# Patient Record
Sex: Female | Born: 1945 | Race: Asian | Hispanic: No | Smoking: Never smoker
Health system: Southern US, Community
[De-identification: ages and names within clinical notes are randomized; demographics above are authoritative.]

## PROBLEM LIST (undated history)

## (undated) DIAGNOSIS — M1711 Unilateral primary osteoarthritis, right knee: Secondary | ICD-10-CM

## (undated) DIAGNOSIS — R159 Full incontinence of feces: Secondary | ICD-10-CM

## (undated) DIAGNOSIS — Z86711 Personal history of pulmonary embolism: Secondary | ICD-10-CM

## (undated) DIAGNOSIS — R32 Unspecified urinary incontinence: Secondary | ICD-10-CM

## (undated) DIAGNOSIS — M1990 Unspecified osteoarthritis, unspecified site: Secondary | ICD-10-CM

## (undated) DIAGNOSIS — S42201A Unspecified fracture of upper end of right humerus, initial encounter for closed fracture: Secondary | ICD-10-CM

## (undated) DIAGNOSIS — I10 Essential (primary) hypertension: Secondary | ICD-10-CM

## (undated) HISTORY — DX: Unilateral primary osteoarthritis, right knee: M17.11

## (undated) HISTORY — DX: Unspecified osteoarthritis, unspecified site: M19.90

---

## 2002-04-27 ENCOUNTER — Encounter: Admission: RE | Admit: 2002-04-27 | Discharge: 2002-04-27 | Payer: Self-pay | Admitting: Gastroenterology

## 2002-04-27 ENCOUNTER — Encounter: Payer: Self-pay | Admitting: Gastroenterology

## 2006-05-28 ENCOUNTER — Encounter: Admission: RE | Admit: 2006-05-28 | Discharge: 2006-05-28 | Payer: Self-pay | Admitting: Orthopedic Surgery

## 2008-01-21 HISTORY — PX: JOINT REPLACEMENT: SHX530

## 2008-08-14 ENCOUNTER — Inpatient Hospital Stay (HOSPITAL_COMMUNITY): Admission: RE | Admit: 2008-08-14 | Discharge: 2008-08-21 | Payer: Self-pay | Admitting: Orthopedic Surgery

## 2008-08-14 ENCOUNTER — Ambulatory Visit: Payer: Self-pay | Admitting: Pulmonary Disease

## 2008-08-17 ENCOUNTER — Ambulatory Visit: Payer: Self-pay | Admitting: Physical Medicine & Rehabilitation

## 2008-08-29 ENCOUNTER — Telehealth: Payer: Self-pay | Admitting: Internal Medicine

## 2008-08-29 ENCOUNTER — Encounter: Payer: Self-pay | Admitting: Internal Medicine

## 2008-08-31 ENCOUNTER — Encounter: Payer: Self-pay | Admitting: Cardiology

## 2008-08-31 DIAGNOSIS — I2699 Other pulmonary embolism without acute cor pulmonale: Secondary | ICD-10-CM

## 2008-08-31 DIAGNOSIS — M1990 Unspecified osteoarthritis, unspecified site: Secondary | ICD-10-CM | POA: Insufficient documentation

## 2008-09-01 ENCOUNTER — Ambulatory Visit: Payer: Self-pay | Admitting: Internal Medicine

## 2008-09-01 DIAGNOSIS — I2699 Other pulmonary embolism without acute cor pulmonale: Secondary | ICD-10-CM | POA: Insufficient documentation

## 2008-09-08 ENCOUNTER — Ambulatory Visit: Payer: Self-pay | Admitting: Cardiology

## 2008-09-15 ENCOUNTER — Ambulatory Visit: Payer: Self-pay | Admitting: Cardiovascular Disease

## 2008-09-22 ENCOUNTER — Ambulatory Visit: Payer: Self-pay | Admitting: Internal Medicine

## 2008-09-29 ENCOUNTER — Ambulatory Visit: Payer: Self-pay | Admitting: Internal Medicine

## 2008-09-29 LAB — CONVERTED CEMR LAB: POC INR: 2.2

## 2008-10-13 ENCOUNTER — Ambulatory Visit: Payer: Self-pay | Admitting: Internal Medicine

## 2008-10-13 LAB — CONVERTED CEMR LAB: POC INR: 3.2

## 2008-10-27 ENCOUNTER — Ambulatory Visit: Payer: Self-pay | Admitting: Internal Medicine

## 2008-10-27 LAB — CONVERTED CEMR LAB: POC INR: 2.3

## 2008-11-14 ENCOUNTER — Ambulatory Visit: Payer: Self-pay | Admitting: Internal Medicine

## 2008-11-17 ENCOUNTER — Ambulatory Visit: Payer: Self-pay | Admitting: Internal Medicine

## 2010-04-27 LAB — CBC
HCT: 26.3 % — ABNORMAL LOW (ref 36.0–46.0)
HCT: 26.6 % — ABNORMAL LOW (ref 36.0–46.0)
Hemoglobin: 9.1 g/dL — ABNORMAL LOW (ref 12.0–15.0)
Hemoglobin: 9.1 g/dL — ABNORMAL LOW (ref 12.0–15.0)
MCHC: 34.6 g/dL (ref 30.0–36.0)
MCV: 92.7 fL (ref 78.0–100.0)
MCV: 93.2 fL (ref 78.0–100.0)
Platelets: 219 10*3/uL (ref 150–400)
RBC: 2.82 MIL/uL — ABNORMAL LOW (ref 3.87–5.11)
RDW: 12.6 % (ref 11.5–15.5)
RDW: 12.7 % (ref 11.5–15.5)
WBC: 5.1 10*3/uL (ref 4.0–10.5)

## 2010-04-27 LAB — PROTIME-INR
INR: 2.6 — ABNORMAL HIGH (ref 0.00–1.49)
Prothrombin Time: 29.5 seconds — ABNORMAL HIGH (ref 11.6–15.2)

## 2010-04-27 LAB — HEPARIN LEVEL (UNFRACTIONATED)

## 2010-04-28 LAB — CBC
HCT: 27.9 % — ABNORMAL LOW (ref 36.0–46.0)
HCT: 39.7 % (ref 36.0–46.0)
Hemoglobin: 10.9 g/dL — ABNORMAL LOW (ref 12.0–15.0)
MCHC: 34.1 g/dL (ref 30.0–36.0)
MCHC: 34.3 g/dL (ref 30.0–36.0)
MCHC: 34.4 g/dL (ref 30.0–36.0)
MCHC: 34.7 g/dL (ref 30.0–36.0)
MCV: 92.4 fL (ref 78.0–100.0)
MCV: 93 fL (ref 78.0–100.0)
MCV: 93.4 fL (ref 78.0–100.0)
MCV: 93.4 fL (ref 78.0–100.0)
Platelets: 172 10*3/uL (ref 150–400)
Platelets: 188 10*3/uL (ref 150–400)
Platelets: 215 10*3/uL (ref 150–400)
Platelets: 259 10*3/uL (ref 150–400)
RBC: 2.83 MIL/uL — ABNORMAL LOW (ref 3.87–5.11)
RBC: 2.99 MIL/uL — ABNORMAL LOW (ref 3.87–5.11)
RBC: 3.19 MIL/uL — ABNORMAL LOW (ref 3.87–5.11)
RDW: 12.4 % (ref 11.5–15.5)
RDW: 12.6 % (ref 11.5–15.5)
RDW: 12.7 % (ref 11.5–15.5)
WBC: 5.9 10*3/uL (ref 4.0–10.5)
WBC: 6.8 10*3/uL (ref 4.0–10.5)

## 2010-04-28 LAB — BASIC METABOLIC PANEL
BUN: 6 mg/dL (ref 6–23)
CO2: 28 mEq/L (ref 19–32)
Calcium: 8.4 mg/dL (ref 8.4–10.5)
Calcium: 8.6 mg/dL (ref 8.4–10.5)
Chloride: 105 mEq/L (ref 96–112)
Chloride: 105 mEq/L (ref 96–112)
Creatinine, Ser: 0.57 mg/dL (ref 0.4–1.2)
Creatinine, Ser: 0.6 mg/dL (ref 0.4–1.2)
GFR calc Af Amer: >60 mL/min (ref >60)
GFR calc Af Amer: >60 mL/min (ref >60)
GFR calc non Af Amer: >60 mL/min (ref >60)
Glucose, Bld: 127 mg/dL — ABNORMAL HIGH (ref 70–99)
Sodium: 136 mEq/L (ref 135–145)

## 2010-04-28 LAB — APTT: aPTT: 29 seconds (ref 24–37)

## 2010-04-28 LAB — URINALYSIS, ROUTINE W REFLEX MICROSCOPIC
Ketones, ur: NEGATIVE mg/dL
Nitrite: NEGATIVE
Protein, ur: NEGATIVE mg/dL
Urobilinogen, UA: 0.2 mg/dL (ref 0.0–1.0)

## 2010-04-28 LAB — HEPARIN LEVEL (UNFRACTIONATED)
Heparin Unfractionated: 0.25 IU/mL — ABNORMAL LOW (ref 0.30–0.70)
Heparin Unfractionated: 0.32 IU/mL (ref 0.30–0.70)
Heparin Unfractionated: 0.33 IU/mL (ref 0.30–0.70)
Heparin Unfractionated: 0.36 IU/mL (ref 0.30–0.70)
Heparin Unfractionated: 0.48 IU/mL (ref 0.30–0.70)

## 2010-04-28 LAB — COMPREHENSIVE METABOLIC PANEL
BUN: 13 mg/dL (ref 6–23)
CO2: 27 mEq/L (ref 19–32)
Calcium: 10.2 mg/dL (ref 8.4–10.5)
Chloride: 102 mEq/L (ref 96–112)
Creatinine, Ser: 0.68 mg/dL (ref 0.4–1.2)
GFR calc non Af Amer: >60 mL/min (ref >60)
Total Bilirubin: 0.9 mg/dL (ref 0.3–1.2)

## 2010-04-28 LAB — URINALYSIS, MICROSCOPIC ONLY
Nitrite: NEGATIVE
Specific Gravity, Urine: 1.03 (ref 1.005–1.030)
Urobilinogen, UA: 0.2 mg/dL (ref 0.0–1.0)

## 2010-04-28 LAB — PROTIME-INR
INR: 1 (ref 0.00–1.49)
INR: 1.2 (ref 0.00–1.49)
INR: 1.9 — ABNORMAL HIGH (ref 0.00–1.49)
INR: 2 — ABNORMAL HIGH (ref 0.00–1.49)
Prothrombin Time: 13.1 seconds (ref 11.6–15.2)
Prothrombin Time: 21.5 seconds — ABNORMAL HIGH (ref 11.6–15.2)
Prothrombin Time: 22.7 seconds — ABNORMAL HIGH (ref 11.6–15.2)
Prothrombin Time: 23.9 seconds — ABNORMAL HIGH (ref 11.6–15.2)
Prothrombin Time: 26.6 seconds — ABNORMAL HIGH (ref 11.6–15.2)

## 2010-04-28 LAB — DIFFERENTIAL
Basophils Absolute: 0 10*3/uL (ref 0.0–0.1)
Lymphocytes Relative: 28 % (ref 12–46)
Lymphs Abs: 1.7 10*3/uL (ref 0.7–4.0)
Neutro Abs: 3.8 10*3/uL (ref 1.7–7.7)

## 2010-04-28 LAB — TYPE AND SCREEN: Antibody Screen: NEGATIVE

## 2010-04-28 LAB — ABO/RH: ABO/RH(D): A POS

## 2010-04-28 LAB — URINE CULTURE: Culture: NO GROWTH

## 2010-06-04 NOTE — Discharge Summary (Signed)
Stacy Wilkinson, Stacy Wilkinson                   ACCOUNT NO.:  [id number]   MEDICAL RECORD NO.:  [id number]          PATIENT TYPE:  INP   LOCATION:  5034                         FACILITY:  [facility]   PHYSICIAN:  Robert A. Thurston Hole, M.D. DATE OF BIRTH:  1945/01/29   DATE OF ADMISSION:  08/14/2008  DATE OF DISCHARGE:  08/19/2008                               DISCHARGE SUMMARY   ADMISSION DIAGNOSIS:  Left knee end-stage osteoarthritis.   SECONDARY DIAGNOSIS:  Pulmonary embolus.   PROCEDURE:  While in hospital, left total knee arthroplasty.   DISCHARGE SUMMARY:  The patient is a 65 year old female with a history  of end-stage DJD of the left knee and has failed conservative care.   ALLERGIES:  No known drug allergies.   MEDICATIONS AT TIME OF ADMISSION:  No prescription medications, multiple  over-the-counter supplements.   PAST MEDICAL HISTORY:  Significant for DJD.   FAMILY HISTORY:  Significant for mother alive at age 8 with positive  dementia.  Father deceased at age 22 with a history of stroke.  No  significant health history in her 6 siblings.   REVIEW OF SYSTEMS:  Positive for occasional headache.  No other notable  review of systems other Jeny glasses.   PHYSICAL EXAMINATION:  VITAL SIGNS:  The patient's temperature at time  of admission 98.9; pulse 76; respiratory, she was 98% O2 on room air;  blood pressure 135/88.  She is a 5 feet 2-1/2-inch, 249-pound female.  GENERAL:  She is alert and oriented x3, in no acute distress.  HEENT:  She is normocephalic, atraumatic.  Ears clear.  Pupils equal and  reactive to light and accommodation.  NECK:  Supple.  CHEST:  Clear.  HEART:  Normal sinus rhythm.  Regular rate and rhythm with mild murmur.  ABDOMEN:  Soft, nontender abdomen with positive bowel sounds.  No  organomegaly.  GENITOURINARY:  Normal breast.  EXTREMITIES:  A 0 to 120 degrees 2+ crepitus with mild varus deformity  of the left knee with 1+ synovitis, 2+ DP pulses.  SKIN:  Warm  and dry.  No rash or abrasions.   X-ray show end-stage DJD of the left knee.  Preoperative labs including  CBC, CMP, chest x-ray, EKG, PT and PTT were all within normal limits and  she had obtained medical clearance prior to her surgery from her medical  doctor.   HOSPITAL COURSE:  On the day of admission, the patient was taken to the  operating room at [facility] where she underwent a left total knee  arthroplasty by Dr. Thurston Hole using DePuy Sigma components.  The patient  was placed on perioperative antibiotics.  She was placed on  postoperative Coumadin prophylaxis with bridging Lovenox and she became  therapeutic.  She was also given foot pumps and CPM was begun in the  PACU in an effort of facilitating both postoperative physical therapy as  well as DVT prophylaxis.  The patient's diet was advanced as tolerated  and she was placed on the PCA pump for pain control.  Postoperative day  #1, the patient was complaining of mild-to-moderate  pain.  She was  tolerating her diet well.  Urine output 1400.  Drain output 100.  __________ discontinued.  Hemoglobin 10.9, WBC is 7.0.  Temperature of  97, pulse of 83, respirations 20, blood pressure 108/70, 100% O2 sats on  O2.  She was alert and oriented x3.  The wound was clean and dry.  Lungs  were clear.  Heart was regular rate and rhythm.  She was neurovascular  intact distally, both motors and light touch, tolerating CPM well.  She  was, otherwise, medically stable.  Incentive spirometry was encouraged  and because the patient has not been with family at home during the day.  Consult was obtained to see if she would qualify for a rehab stay at a  skilled nursing facility.   Postop day #2, the patient had been making slow-to-steady progress in  physical therapy.  However, physical exam showed that she had a pulse of  105 with a T-max of 102.3, respirations of 20 with O2 sat of 93 on room  air.  The patient complained of some mild shortness  of breath.  No  nausea or vomiting.  No significant burning when urinating.  INR 2.0.  Alert and oriented x3.  Wound remained clean and dry with no signs of  infection.  Calf was soft and nontender.  Otherwise, neurovascular  intact.  Because of her elevated temperature and pulse, a spiral CT was  performed of her lungs, which showed a small PE to the right mid and  lower lungs.  [facility] was consulted to begin  her care for her pulmonary embolism.  They began an IV drip to comply  with her Coumadin treatment.  Physical therapy was restarted the  following day.   On postop day #3, the patient reported slight improvement in her knee.  She is feeling a lot better overall.  T max was 100.7, pulse 90,  respirations 18, and O2 sats were 95.  WBC remained stable at 5.4 and  hemoglobin had dropped to 9.6.  INR 1.8.  She was otherwise improved and  wound was benign.  Rehab consult was obtained because of the patient's  anaerobe to progress due to her treatment for PE and it was felt that  she would benefit from an inpatient rehab stay for both the doctors of  rehab as well as physical therapist and OT.  Unfortunately, her  insurance denied the request.   Postoperative day #4, the patient continued to make slow, but steady  progress in physical therapy.  She had mild-to-moderate pain which was  worse after physical therapy.  T max of 99.6, pulse of 79, O2 sats of  94.  She remained alert and orientedx3.  Wound was clean and dry.  Slight increase in edema versus previous exam.  The calf remained soft  and nontender.  Heart was regular rate and rhythm.  Lungs remained  clear.  She had had a positive bowel movement and was having no  difficulty tolerating food and was otherwise stable orthopedically,  improving medically.  It was felt by Pulmonary that she needed to become  therapeutic of [place] 2.0 on her INR for she could be discontinued  on her heparin, so she  remained an additional day and physical therapy  continued.  On her fifth and sixth  postoperative days, she continued to need  intensive physical therapy and pulmonary monitoring for her pulmonary  embolus. Whe continued to make progress.  At the time of her discharge, her medications will be multivitamin,  Coumadin per pharmacy protocol with a target INR of 2-3, Colace 100 mg  p.o. b.i.d., Robaxin 500 mg p.o. q.6 h. p.r.n. spasm, Tylenol as needed  for increased temperature, and Percocet 5/325 one to two q.4 h. p.r.n.  pain.   The patient should return to see Dr. Thurston Hole in 2 weeks postoperatively  for suture removal.  She should have home health physical therapy as  well as home CPM and home health are in for wound checks.  She should  return sooner should that flare, should her temperature go over 101,  should her pulse rate be steadily tachycardic over 100 as well as if her  pain level increases or any drainage from the wound.  At the time of her  discharge, she was tolerating a regular diet and should continue with  the same activity as weightbearing as tolerated with a walker for  ambulation as  well as continued CPM for range of motion.  Dressing changes daily or as  needed to keep the dressing clean and dry.  She may shower on  postoperative day #7 in the shower chair.   At the time of her discharge, she was orthopedically stable and  medically improving.      Laural Benes. Jannet Mantis.      Robert A. Thurston Hole, M.D.  Electronically Signed    JBR/MEDQ  D:  08/18/2008  T:  08/19/2008  Job:  [id number]

## 2010-06-04 NOTE — Discharge Summary (Signed)
NAMESTEPHANIEMARIE, Stacy Wilkinson                   ACCOUNT NO.:  [id number]   MEDICAL RECORD NO.:  [id number]          PATIENT TYPE:  INP   LOCATION:  5034                         FACILITY:  [facility]   PHYSICIAN:  Robert A. Thurston Hole, M.D. DATE OF BIRTH:  1945/05/17   DATE OF ADMISSION:  08/14/2008  DATE OF DISCHARGE:  08/21/2008                         DISCHARGE SUMMARY - REFERRING   ADDENDUM   ADMITTING DIAGNOSIS:  End-stage degenerative joint disease, left knee.   DISCHARGE DIAGNOSES:  1. End-stage degenerative joint disease, left knee, status post total      knee replacement.  2. Postoperative blood loss anemia.  3. Pulmonary embolism.   PROCEDURE IN-HOUSE:  On August 14, 2008, the patient underwent a left  total knee replacement.   HOSPITAL COURSE:  The patient was admitted postoperatively for pain  control, DVT prophylaxis, and physical therapy.  She underwent 1100 mL  Auto-Vac transfusion which she tolerated well.  She was given Coumadin,  Lovenox, and foot pumps for DVT prophylaxis and CPM was begun.  In PACU,  she was placed on a PCA for pain control.  PCA was discontinued.  Her  drain was discontinued postop day #1.  Postop day #2, the patient had a  T-max of 102.3, respirations were 20, O2 saturations were 93% on room  air, mild shortness of breath.  INR was 2.0.  Because of her elevated  temperature, pulse, and low O2 status, a spiral CT was performed that  did show a small PE.  Critical Care was consulted.  They began IV  heparin and continued her Coumadin.  Postop day #3, the patient was  improved, O2 sats were 95%, and T-max was 100.1.  Rehab consult was  obtained.  Postop day #4, the patient continued to make progress in  physical therapy.  The patient had a bowel movement.  [facility] denied both rehab and short-term skilled nursing.  Postop day #5,  the patient continued to progress with physical therapy and still had  limited quad control.  Postop day #6, the patient  declined physical  therapy secondary to pain.  Postop day #7, the patient walked around the  nurse's station with physical therapy, uses a sheet to assist her with  bed mobility.  She is being discharged to home in stable condition,  weightbearing as tolerated, on Coumadin, Percocet, and Robaxin.  Coumadin per pharmacy protocol, Robaxin 500 mg 1 tablet p.o. q.6-8 h  p.r.n. muscle spasm, and Percocet is 1-2 q.4 h p.r.n. pain.  She will  need home health physical therapy and home health occupational therapy.  She is allowed to shower.  She is on a regular diet.  She will need  follow up in [facility] as an outpatient after home health  had stopped.  She will need to be on Coumadin anywhere from 3-6 months  depending on Critical Care's recommendation.  We will see her back in  the office in 1 week on July 27, 2004.      Kirstin Shepperson, P.A.      Robert A. Thurston Hole, M.D.  Electronically Signed    KS/MEDQ  D:  08/21/2008  T:  08/22/2008  Job:  [id number]

## 2010-06-04 NOTE — Op Note (Signed)
NAME:  Stacy Wilkinson, Stacy Wilkinson                   ACCOUNT NO.:  [id number]   MEDICAL RECORD NO.:  [id number]          PATIENT TYPE:  INP   LOCATION:  2550                         FACILITY:  [facility]   PHYSICIAN:  Robert A. Thurston Hole, M.D. DATE OF BIRTH:  05/05/1945   DATE OF PROCEDURE:  08/14/2008  DATE OF DISCHARGE:                               OPERATIVE REPORT   PREOPERATIVE DIAGNOSIS:  Left knee degenerative joint disease.   POSTOPERATIVE DIAGNOSIS:  Left knee degenerative joint disease.   PROCEDURE:  Left total knee replacement using DePuy cemented total knee  system with #3 cemented femur, #4 cemented tibia with 15-mm polyethylene  RP tibial spacer and 32-mm polyethylene cemented patella.   SURGEON:  Elana Alm. Thurston Hole, MD   ASSISTANT:  Julien Girt, PA   ANESTHESIA:  General.   OPERATIVE TIME:  1 hour 40 minutes.   COMPLICATIONS:  None.   DESCRIPTION OF PROCEDURE:  Stacy Wilkinson was brought to the operating room on  August 14, 2008, after femoral nerve block was placed in holding by  anesthesia.  She was placed on the operative table in supine position.  She received Ancef 2 g IV preoperatively for prophylaxis.  After being  placed under general anesthesia, she had a Foley catheter placed under  sterile conditions.  Her left knee was examined.  Range of motion from -  5 to 125 degrees, knee stable with normal patellar tracking.  Moderate  varus deformity noted.  Left leg was prepped using sterile DuraPrep and  draped using sterile technique.  Originally, through a 15-cm  longitudinal incision based over the patella, initial exposure was made.  The underlying subcutaneous tissues were incised along with skin  incision.  The median arthrotomy was performed revealing an excessive  amount of normal-appearing joint fluid.  The articular surfaces were  inspected.  She had grade 4 changes medially, laterally and in the  patellofemoral joint.  Osteophytes removed from the femoral condyles and  tibial plateau.  The medial and lateral meniscal remnants were removed  as well as the anterior cruciate ligament.  An intramedullary drill was  then drilled up the femoral canal for placement of distal femoral  cutting jig, which was placed in the appropriate manner by rotation and  a distal 11 mm cut was made.  The distal femur was incised.  The #3 was  found be the appropriate size.  The #3 cutting jig was placed in the  appropriate manner of external rotation and then these cuts were made.  The proximal tibia was then exposed.  The tibial spines were removed  with an oscillating saw.  Intramedullary drill was then drilled down the  tibial canal for placement of the proximal tibial cutting jig, which was  placed in appropriate manner by rotation and a proximal 6-mm cut was  made based off the medial or lower side.  Spacer blocks were then placed  in flexion and extension.  A 15-mm blocks gave excellent balancing,  excellent stability, and excellent correction of her flexion and varus  deformities with normal patellar mobility.  At this  point, the #4 tibial  baseplate trial was placed on the cut tibial surface with an excellent  fit and a keel cut was made.  The PCL box cutter was placed on the  distal femur and these cuts were made.  At this point, the #3 femoral  trial was placed with a #4 tibial baseplate trial and a 15-mm  polyethylene RP tibial spacer, knee was reduced, taken through range of  motion from 0-125 degrees with excellent stability and excellent  correction of her flexion and varus deformities and normal patellar  tracking.  A resurfacing 9-mm cut was then made on the patella and 3  locking holes placed for a 32-mm patella.  The patella trial was placed  and again patellofemoral tracking was evaluated and found to be normal.  At this point, it was felt that all the trial components were of  excellent size, fit, and stability.  They were then removed.  The knee  was then  jet lavage with 3 liters of saline.  The proximal tibia was  then exposed.  The #4 tibial baseplate with cement backing was hammered  into position with an excellent fit with excess cement being removed  from around the edges.  The #3 femoral component with cement backing was  hammered in position also with an excellent fit with excess cement being  removed from around the edges.  The 15-mm polyethylene RP tibial spacer  was placed on the tibial baseplate.  The knee reduced, taken through  range of motion from 0-125 degrees with excellent stability and  excellent correction of her flexion and varus deformities.  At this  point, the 32-mm polyethylene cement backed patella was then placed in  its position and held there with a clamp.  After the cement hardened,  again patellofemoral tracking was evaluated and found to be normal.  At  this point, it was felt that all the components were excellent size,  fit, and stability.  The knee was further irrigated with saline and then  the arthrotomy was closed with #1 Ethibond suture over 2-medium Hemovac  drains.  Subcutaneous tissues closed with 0 and 2-0 Vicryl, subcuticular  layer closed with 4-0 Monocryl.  Sterile dressings and a long-leg splint  applied.  The patient then awakened, extubated, and taken to recovery in  stable condition.  Needle, sponge counts were correct x2 at the end of  the case.  Neurovascular status normal, pulses 1+ and symmetric.      Robert A. Thurston Hole, M.D.  Electronically Signed     RAW/MEDQ  D:  08/14/2008  T:  08/15/2008  Job:  [id number]

## 2012-01-09 ENCOUNTER — Ambulatory Visit (INDEPENDENT_AMBULATORY_CARE_PROVIDER_SITE_OTHER): Payer: BC Managed Care – PPO | Admitting: Emergency Medicine

## 2012-01-09 VITALS — BP 149/83 | HR 64 | Temp 98.0°F | Resp 18 | Ht 64.5 in | Wt 270.0 lb

## 2012-01-09 DIAGNOSIS — Z0000 Encounter for general adult medical examination without abnormal findings: Secondary | ICD-10-CM

## 2012-01-09 LAB — POCT URINALYSIS DIPSTICK
Blood, UA: NEGATIVE
Glucose, UA: NEGATIVE
Leukocytes, UA: NEGATIVE
Nitrite, UA: NEGATIVE
Urobilinogen, UA: 0.2

## 2012-01-09 LAB — POCT UA - MICROSCOPIC ONLY
Casts, Ur, LPF, POC: NEGATIVE
Mucus, UA: NEGATIVE
Yeast, UA: NEGATIVE

## 2012-01-09 NOTE — Progress Notes (Signed)
[facility] [address] [place] [address] [phone]  Date:  01/09/2012   Name:  NDIA SAMPATH   DOB:  December 09, 1945   MRN:  [id number]  PCP:  No primary provider on file.    Chief Complaint: CPE   History of Present Illness:  Sacheen T Shampine is a 66 y.o. very pleasant female patient who presents with the following:  Annual wellness examination.  Within three year window for pap and has had mammogram this year.  Refuses colonoscopy due to cost. Denies current complaints.  Takes no prescription medications.  Has no current health concerns or complaints.  Patient Active Problem List  Diagnosis  . PULMONARY EMBOLISM  . PULMONARY EMBOLISM  . OSTEOARTHRITIS    Past Medical History  Diagnosis Date  . Arthritis     Past Surgical History  Procedure Date  . Joint replacement     History  Substance Use Topics  . Smoking status: Never Smoker   . Smokeless tobacco: Not on file  . Alcohol Use: No    History reviewed. No pertinent family history.  No Known Allergies  Medication list has been reviewed and updated.  No current outpatient prescriptions on file prior to visit.    Review of Systems:  As per HPI, otherwise negative.    Physical Examination: Filed Vitals:   01/09/12 1219  BP: 149/83  Pulse: 64  Temp: 98 F (36.7 C)  Resp: 18   Filed Vitals:   01/09/12 1219  Height: 5' 4.5" (1.638 m)  Weight: 270 lb (122.471 kg)   Body mass index is 45.63 kg/(m^2). Ideal Body Weight: Weight in (lb) to have BMI = 25: 147.6   GEN: WDWN, NAD, Non-toxic, A & O x 3 HEENT: Atraumatic, Normocephalic. Neck supple. No masses, No LAD. Ears and Nose: No external deformity. CV: RRR, No M/G/R. No JVD. No thrill. No extra heart sounds. PULM: CTA B, no wheezes, crackles, rhonchi. No retractions. No resp. distress. No accessory muscle use. ABD: S, NT, ND, +BS. No rebound. No HSM. EXTR: No c/c/e NEURO Normal gait.  PSYCH: Normally interactive.  Conversant. Not depressed or anxious appearing.  Calm demeanor.  Rectal:  Patient refused Breast:  Patient refused GYN:  Patient refused  Assessment and Plan: Wellness exam Labs Follow up based on labs  Carmelina Dane, MD

## 2012-01-09 NOTE — Addendum Note (Signed)
Addended by: Carmelina Dane on: 01/09/2012 12:51 PM   Modules accepted: Orders

## 2012-01-10 ENCOUNTER — Other Ambulatory Visit (INDEPENDENT_AMBULATORY_CARE_PROVIDER_SITE_OTHER): Payer: BC Managed Care – PPO

## 2012-01-10 VITALS — BP 137/84 | HR 71

## 2012-01-10 DIAGNOSIS — Z0000 Encounter for general adult medical examination without abnormal findings: Secondary | ICD-10-CM

## 2012-01-10 LAB — LIPID PANEL
Cholesterol: 168 mg/dL (ref 0–200)
Triglycerides: 87 mg/dL (ref <150)

## 2012-01-10 LAB — COMPREHENSIVE METABOLIC PANEL
ALT: 18 U/L (ref 0–35)
Albumin: 4.1 g/dL (ref 3.5–5.2)
CO2: 28 mEq/L (ref 19–32)
Glucose, Bld: 101 mg/dL — ABNORMAL HIGH (ref 70–99)
Potassium: 4.5 mEq/L (ref 3.5–5.3)
Sodium: 139 mEq/L (ref 135–145)
Total Protein: 6.6 g/dL (ref 6.0–8.3)

## 2012-01-10 LAB — POCT CBC
Granulocyte percent: 56.2 %G (ref 37–80)
HCT, POC: 44.1 % (ref 37.7–47.9)
Hemoglobin: 13.2 g/dL (ref 12.2–16.2)
Lymph, poc: 1.7 (ref 0.6–3.4)
MCHC: 29.9 g/dL — AB (ref 31.8–35.4)
MCV: 96.4 fL (ref 80–97)
POC Granulocyte: 2.7 (ref 2–6.9)

## 2012-01-10 LAB — TSH: TSH: 3.646 u[IU]/mL (ref 0.350–4.500)

## 2012-01-10 NOTE — Progress Notes (Signed)
Patient here for labs only. 

## 2012-01-12 ENCOUNTER — Encounter: Payer: Self-pay | Admitting: *Deleted

## 2012-04-05 ENCOUNTER — Encounter: Payer: Self-pay | Admitting: Family Medicine

## 2012-09-13 DIAGNOSIS — M1710 Unilateral primary osteoarthritis, unspecified knee: Secondary | ICD-10-CM | POA: Diagnosis not present

## 2012-09-13 DIAGNOSIS — M5137 Other intervertebral disc degeneration, lumbosacral region: Secondary | ICD-10-CM | POA: Diagnosis not present

## 2012-09-28 DIAGNOSIS — M25569 Pain in unspecified knee: Secondary | ICD-10-CM | POA: Diagnosis not present

## 2012-09-28 DIAGNOSIS — M5137 Other intervertebral disc degeneration, lumbosacral region: Secondary | ICD-10-CM | POA: Diagnosis not present

## 2012-10-12 ENCOUNTER — Ambulatory Visit (INDEPENDENT_AMBULATORY_CARE_PROVIDER_SITE_OTHER): Payer: Medicare Other | Admitting: Family Medicine

## 2012-10-12 VITALS — BP 132/82 | HR 80 | Temp 98.0°F | Resp 20 | Ht 63.0 in | Wt 269.0 lb

## 2012-10-12 DIAGNOSIS — M25569 Pain in unspecified knee: Secondary | ICD-10-CM | POA: Diagnosis not present

## 2012-10-12 DIAGNOSIS — L02419 Cutaneous abscess of limb, unspecified: Secondary | ICD-10-CM

## 2012-10-12 DIAGNOSIS — M79609 Pain in unspecified limb: Secondary | ICD-10-CM

## 2012-10-12 DIAGNOSIS — L03116 Cellulitis of left lower limb: Secondary | ICD-10-CM

## 2012-10-12 DIAGNOSIS — M25561 Pain in right knee: Secondary | ICD-10-CM

## 2012-10-12 MED ORDER — CEPHALEXIN 500 MG PO CAPS: 500.0000 mg | ORAL_CAPSULE | Freq: Three times a day (TID) | ORAL | Status: DC

## 2012-10-12 MED ORDER — SULFAMETHOXAZOLE-TRIMETHOPRIM 800-160 MG PO TABS: 1.0000 | ORAL_TABLET | Freq: Two times a day (BID) | ORAL | Status: DC

## 2012-10-12 NOTE — Patient Instructions (Addendum)
1.  ICE R KNEE TWICE DAILY FOR 15 MINUTES. 2.  INCREASE ALEVE TO TWO TABLETS TWICE DAILY FOR NEXT TWO WEEKS; TAKE WITH FOOD. 3.  TAKE ANTIBIOTICS AS PRESCRIBED. 4.  APPLY HEAT TO L LOWER LEG TWICE DAILY FOR 15 MINUTES. 5. ELEVATE LEFT LEG WHILE SITTING.   Lateral Collateral Knee Ligament Sprain with Phase I Rehab The lateral collateral ligament (LCL) of the knee helps hold the knee joint in proper alignment and prevents the bones from shifting out of alignment (displacing) toward the outside (laterally). Injury to the knee may cause a tear in the LCL ligament (sprain). The LCL is the least common ligament of the knee to be injured. Sprains may heal on their own, but they often result in a loose joint. Sprains are classified into three categories. Grade 1 sprains cause pain, but the tendon is not lengthened. Grade 2 sprains include a lengthened ligament, due to the ligament being stretched or partially ruptured. With grade 2 sprains there is still function, although the function may be decreased. Grade 3 sprains involve a complete tear of the tendon or muscle, and function is usually impaired. SYMPTOMS   Pain and tenderness on the outer side of the knee.  A "pop", tearing, or pulling sensation at the time of injury.  Bruising (contusion) at the site of injury within 48 hours of injury.  Knee stiffness.  Limping, often walking with the knee bent. CAUSES  An LCL sprain occurs when a force is placed on the ligament that is greater [place] it can handle. Common causes of injury include:  Direct hit (trauma) to the inner side of the knee, especially if the foot is planted on the ground.  Forceful pivoting of the body and leg, while the foot is planted on the ground. RISK INCREASES WITH:  Contact sports (football, rugby).  Sports that require pivoting or cutting (soccer).  Poor knee strength and flexibility.  Improper equipment use. PREVENTION   Warm up and stretch properly before  activity.  Maintain physical fitness:  Strength, flexibility, and endurance.  Cardiovascular fitness.  Wear properly fitted protective equipment (correct length of cleats for surface).  Functional braces may be effective in preventing injury. PROGNOSIS  If treated properly, LCL tears usually heal on their own. Sometimes, surgery is required. RELATED COMPLICATIONS   Frequently recurring symptoms, such as knee giving way, instability, and swelling.  Injury to other structures in the knee joint.  Meniscal cartilage, resulting in locking and swelling of the knee.  Articular cartilage, resulting in knee arthritis.  Other ligaments of the knee (commonly).  Injury to nerves, causing numbness of the outer leg, foot, and ankle and weakness or paralysis, with inability to raise the ankle, big toe, or lesser toes.  Knee stiffness (loss of knee motion). TREATMENT  Treatment first involves the use of ice and medicine, to reduce pain and inflammation. The use of strengthening and stretching exercises may help reduce pain with activity. These exercises may be performed at home, but referral to a therapist is often advised. You may be advised to walk with crutches, until you are able to walk without a limp. Your caregiver may provide you with a hinged knee brace to help regain a full range of motion, while also protecting the injured knee. For severe LCL injuries, or injuries that involve other ligaments of the knee, surgery is often advised. MEDICATION   If pain medicine is needed, nonsteroidal anti-inflammatory medicines (aspirin and ibuprofen), or other minor pain relievers (acetaminophen), are  often advised.  Do not take pain medicine for 7 days before surgery.  Prescription pain relievers may be given, if your caregiver thinks they are needed. Use only as directed and only as much as you need. HEAT AND COLD  Cold treatment (icing) should be applied for 10 to 15 minutes every 2 to 3 hours  for inflammation and pain, and immediately after activity that aggravates your symptoms. Use ice packs or an ice massage.  Heat treatment may be used before performing stretching and strengthening activities prescribed by your caregiver, physical therapist, or athletic trainer. Use a heat pack or a warm water soak. SEEK MEDICAL CARE IF:   Symptoms get worse or do not improve in 4 to 6 weeks, despite treatment.  New, unexplained symptoms develop. (Drugs used in treatment may produce side effects.) EXERCISES RANGE OF MOTION (ROM) AND STRETCHING EXERCISES - Lateral Collateral Knee Ligament Sprain Phase I These are some of the initial exercises that your physician, physical therapist or athletic trainer may have you perform to begin your rehabilitation. When you demonstrate gains in your flexibility and strength, your caregiver may progress you to Phase II exercises. As you perform these exercises, remember:   These initial exercises are intended to be gentle. They will help you restore motion without increasing any swelling.  Completing these exercises allows less painful movement and prepares you for the more aggressive strengthening exercises in Phase II.  An effective stretch should be held for at least 30 seconds.  A stretch should never be painful. You should only feel a gentle lengthening or release in the stretched tissue. RANGE OF MOTION - Knee Flexion, Active  Lie on your back with both knees straight. (If this causes back discomfort, bend your opposite knee, placing your foot flat on the floor.)  Slowly slide your heel back toward your buttocks until you feel a gentle stretch in the front of your knee or thigh.  Hold for __________ seconds. Slowly slide your heel back to the starting position. Repeat __________ times. Complete this exercise __________ times per day.  STRETCH - Knee Flexion, Supine  Lie on the floor with your right / left heel and foot lightly touching the wall.  (Place both feet on the wall, if you do not use a door frame.)  Without using any effort, allow gravity to slide your foot down the wall slowly until you feel a gentle stretch in the front of your right / left knee.  Hold this stretch for __________ seconds. Then return the leg to the starting position, using your healthy leg for help, if needed. Repeat __________ times. Complete this stretch __________ times per day.  RANGE OF MOTION - Knee Flexion and Extension, Active-Assisted  Sit on the edge of a table or chair with your thighs firmly supported. It may be helpful to place a folded towel under the end of your right / left thigh.  Flexion (bending): Place the ankle of your healthy leg on top of the other ankle. Use your healthy leg to gently bend your right / left knee until you feel a mild tension across the top of your knee.  Hold for __________ seconds.  Extension (straightening): Switch your ankles so your right / left leg is on top. Use your healthy leg to straighten your right / left knee until you feel a mild tension on the backside of your knee.  Hold for __________ seconds. Repeat __________ times. Complete this exercise __________ times per day. STRETCH - Knee  Extension Sitting  Sit with yourright / left leg/heel propped on another chair, coffee table, or foot stool.  Allow your leg muscles to relax, letting gravity straighten out your knee.*  You should feel a stretch behind your right / left knee. Hold this position for __________ seconds. Repeat __________ times. Complete this stretch __________ times per day.  *Your physician, physical therapist or athletic trainer may instruct you place a __________ weight on your thigh, just above your kneecap, to deepen the stretch.  STRENGTHENING EXERCISES Lateral Collateral Knee Ligament Sprain - Phase I These exercises may help you when beginning to rehabilitate your injury. They may resolve your symptoms with or without further  involvement from your physician, physical therapist or athletic trainer. While completing these exercises, remember:   Muscles can gain both the endurance and the strength needed for everyday activities through controlled exercises.  Complete these exercises as instructed by your physician, physical therapist or athletic trainer. Increase the resistance and repetitions only as guided.  In order to return to more demanding activities, you will likely need to progress to more challenging exercises. Your physician, physical therapist or athletic trainer will advance your exercises when your tissues show adequate healing and your muscles demonstrate increased strength. STRENGTH - Quadriceps, Isometrics  Lie on your back with your right / left leg extended and your opposite knee bent.  Gradually tense the muscles in the front of yourright / left thigh. You should see either your knee cap slide up toward your hip or increased dimpling just above the knee. This motion will push the back of the knee down toward the floor, mat, or bed on which you are lying.  Hold the muscle as tight as you can without increasing your pain for __________ seconds.  Relax the muscles slowly and completely between each repetition. Repeat __________ times. Complete this exercise __________ times per day.  STRENGTH - Quadriceps, Short Arcs   Lie on your back. Place a __________ inch towel roll under your right / left knee, so that the knee bends slightly.  Raise only your lower leg by tightening the muscles in the front of your thigh. Do not allow your thigh to rise.  Hold this position for __________ seconds. Repeat __________ times. Complete this exercise __________ times per day.  OPTIONAL ANKLE WEIGHTS: Begin with ____________________, but DO NOT exceed ____________________. Increase in 1 pound/0.5 kilogram increments. STRENGTH - Quadriceps, Straight Leg Raises  Quality counts! Watch for signs that the quadriceps  muscle is working, to be sure you are strengthening the correct muscles and not "cheating" by substituting with healthier muscles.  Lay on your back with your right / left leg extended and your opposite knee bent.  Tense the muscles in the front of your right / leftthigh. You should see either your knee cap slide up or increased dimpling just above the knee. Your thigh may even shake a bit.  Tighten these muscles even more and raise your leg 4 to 6 inches off the floor. Hold for __________ seconds.  Keeping these muscles tense, lower your leg.  Relax the muscles slowly and completely in between each repetition. Repeat __________ times. Complete this exercise __________ times per day.  STRENGTH - Hamstring, Isometrics   Lie on your back, on a firm surface.  Bend your right / left knee approximately __________ degrees.  Dig your heel into the surface as if you are trying to pull it toward your buttocks. Tighten the muscles in the back of your  thighs to "dig" as hard as you can, without increasing any pain.  Hold this position for __________ seconds.  Release the tension gradually and allow your muscle to completely relax for __________ seconds in between each exercise. Repeat __________ times. Complete this exercise __________ times per day.  STRENGTH - Hamstring, Curls   Lay on your stomach with your legs extended. (If you lay on a bed, your feet may hang over the edge.)  Tighten the muscles in the back of your thigh to bend your right / left knee up to 90 degrees. Keep your hips flat on the bed.  Hold this position for __________ seconds.  Slowly lower your leg back to the starting position. Repeat __________ times. Complete this exercise __________ times per day.  OPTIONAL ANKLE WEIGHTS: Begin with ____________________, but DO NOT exceed ____________________. Increase in 1 pound/0.5 kilogram increments. Document Released: 01/06/2005 Document Revised: 03/31/2011 Document Reviewed:  04/20/2008 [facility] Patient Information 2014 [place], [place].

## 2012-10-12 NOTE — Progress Notes (Signed)
[address]   [place], [place]  [address]   [id number]  Subjective:    Patient ID: Stacy Wilkinson, female    DOB: 1945-05-30, 67 y.o.   MRN: [id number]  Wound Check   This 67 y.o. female presents for evaluation of LLE wound.  Hit cart corner 2.5 years ago. Improved.  Recurrent issue.  One month ago, started developing red hard area.  Aching; sharp pains along LLE.  S/p L TKR three years ago.  Told ortho about L leg wound; s/p xray; bone normal.  No medications prescribed by ortho.  R posterior calf pain; no knee pain on R.  Stiffness in R posterior thigh; s/p xray of R knee; diagnoed with OA; no medications prescribed.  Recommended spinal specialist.  Pain did not originate from back; recommended knee steroid injection.  S/p ortho consult twenty days ago; s/p spine specialist ten days ago.  May warrant MRI or knee.  Appointment on 10/21/12.  No medication pre S/p R knee injection in past three weeks ago; pain free for two weeks.  [facility]; [facility]. Ortho prescribed expensive antibiotic; also prescribed Prednisone taper.   PCP: [facility]   Review of Systems  Constitutional: Negative for fever, chills, diaphoresis and fatigue.  Musculoskeletal: Positive for myalgias, arthralgias and gait problem. Negative for back pain and joint swelling.  Skin: Positive for color change and wound.  Neurological: Negative for weakness.   Past Medical History  Diagnosis Date  . Arthritis    Past Surgical History  Procedure Laterality Date  . Joint replacement      Left TKR.   No Known Allergies Current Outpatient Prescriptions on File Prior to Visit  Medication Sig Dispense Refill  . acetaminophen (TYLENOL EX ST ARTHRITIS PAIN) 500 MG tablet Take 500 mg by mouth daily.      . fish oil-omega-3 fatty acids 1000 MG capsule Take 2 g by mouth daily.      Marland Kitchen glucosamine-chondroitin 500-400 MG tablet Take 1 tablet by mouth 3 (three) times daily.      . Multiple Vitamins-Minerals (MULTIVITAMIN  WITH MINERALS) tablet Take 1 tablet by mouth daily.       No current facility-administered medications on file prior to visit.       Objective:   Physical Exam  Nursing note and vitals reviewed. Constitutional: She appears well-developed and well-nourished. No distress.  Musculoskeletal:       Right knee: She exhibits normal range of motion, no swelling and no bony tenderness. Tenderness found. Lateral joint line tenderness noted. No medial joint line, no MCL, no LCL and no patellar tendon tenderness noted.       Lumbar back: Normal. She exhibits normal range of motion, no tenderness, no pain and no spasm.  Lumbar spine:  Full ROM lumbar spine without pain; straight leg raises negative; motor 5/5 BLE. R knee: no swelling/effusion; McMurray's negative; Lachman's negative; anterior drawer negative.    Skin: Skin is warm and dry. She is not diaphoretic. There is erythema.     L distal leg with two healing 5mm eschar without erythema; inferior to areas of eschar, 2 cm area of induration, erythema, TTP.  No streaking; no fluctuants.      Assessment & Plan:  Lateral knee pain, right  Pain of lower leg, left  Cellulitis of leg, left - Plan: sulfamethoxazole-trimethoprim (BACTRIM DS,SEPTRA DS) 800-160 MG per tablet, cephALEXin (KEFLEX) 500 MG capsule  1.  R lateral knee pain:  New.  S/p ortho  consult with R knee films; s/p R knee steroid injection.  Persistent R lateral knee pain; recommend increasing Aleve to two tablets bid for two weeks; ice knee bid for two weeks; home exercise program provided.  Follow-up with ortho in two weeks. 2.  LLE cellulitis:  New.  S/p antibiotic by ortho with persistent symptoms; rx for Keflex and bactrim provided; elevate leg at rest; heat to area bid.  RTC for worsening redness, swelling, pain or fever.  Meds ordered this encounter  Medications  . sulfamethoxazole-trimethoprim (BACTRIM DS,SEPTRA DS) 800-160 MG per tablet    Sig: Take 1 tablet by mouth 2  (two) times daily.    Dispense:  20 tablet    Refill:  0  . cephALEXin (KEFLEX) 500 MG capsule    Sig: Take 1 capsule (500 mg total) by mouth 3 (three) times daily.    Dispense:  30 capsule    Refill:  0

## 2012-10-21 DIAGNOSIS — M25569 Pain in unspecified knee: Secondary | ICD-10-CM | POA: Diagnosis not present

## 2012-10-29 DIAGNOSIS — M1710 Unilateral primary osteoarthritis, unspecified knee: Secondary | ICD-10-CM | POA: Diagnosis not present

## 2012-11-04 DIAGNOSIS — M1710 Unilateral primary osteoarthritis, unspecified knee: Secondary | ICD-10-CM | POA: Diagnosis not present

## 2012-11-17 DIAGNOSIS — Z23 Encounter for immunization: Secondary | ICD-10-CM | POA: Diagnosis not present

## 2012-12-02 DIAGNOSIS — M1710 Unilateral primary osteoarthritis, unspecified knee: Secondary | ICD-10-CM | POA: Diagnosis not present

## 2012-12-02 DIAGNOSIS — Z23 Encounter for immunization: Secondary | ICD-10-CM | POA: Diagnosis not present

## 2013-02-08 ENCOUNTER — Other Ambulatory Visit: Payer: Self-pay | Admitting: Family Medicine

## 2013-02-08 DIAGNOSIS — Z1231 Encounter for screening mammogram for malignant neoplasm of breast: Secondary | ICD-10-CM

## 2013-02-08 DIAGNOSIS — E782 Mixed hyperlipidemia: Secondary | ICD-10-CM | POA: Diagnosis not present

## 2013-02-08 DIAGNOSIS — Z136 Encounter for screening for cardiovascular disorders: Secondary | ICD-10-CM | POA: Diagnosis not present

## 2013-02-08 DIAGNOSIS — I10 Essential (primary) hypertension: Secondary | ICD-10-CM | POA: Diagnosis not present

## 2013-02-08 DIAGNOSIS — Z01818 Encounter for other preprocedural examination: Secondary | ICD-10-CM | POA: Diagnosis not present

## 2013-02-11 DIAGNOSIS — R52 Pain, unspecified: Secondary | ICD-10-CM | POA: Diagnosis not present

## 2013-02-11 DIAGNOSIS — M25519 Pain in unspecified shoulder: Secondary | ICD-10-CM | POA: Diagnosis not present

## 2013-02-12 ENCOUNTER — Emergency Department (HOSPITAL_COMMUNITY): Payer: Medicare Other

## 2013-02-12 ENCOUNTER — Emergency Department (HOSPITAL_COMMUNITY)
Admission: EM | Admit: 2013-02-12 | Discharge: 2013-02-12 | Disposition: A | Discharge status: Home / Self Care | Payer: Medicare Other | Attending: Emergency Medicine | Admitting: Emergency Medicine

## 2013-02-12 ENCOUNTER — Encounter (HOSPITAL_COMMUNITY): Payer: Self-pay | Admitting: Emergency Medicine

## 2013-02-12 DIAGNOSIS — Z79899 Other long term (current) drug therapy: Secondary | ICD-10-CM | POA: Diagnosis not present

## 2013-02-12 DIAGNOSIS — S42209A Unspecified fracture of upper end of unspecified humerus, initial encounter for closed fracture: Secondary | ICD-10-CM | POA: Diagnosis not present

## 2013-02-12 DIAGNOSIS — S42309A Unspecified fracture of shaft of humerus, unspecified arm, initial encounter for closed fracture: Secondary | ICD-10-CM | POA: Insufficient documentation

## 2013-02-12 DIAGNOSIS — Z7982 Long term (current) use of aspirin: Secondary | ICD-10-CM | POA: Insufficient documentation

## 2013-02-12 DIAGNOSIS — Y939 Activity, unspecified: Secondary | ICD-10-CM | POA: Insufficient documentation

## 2013-02-12 DIAGNOSIS — I10 Essential (primary) hypertension: Secondary | ICD-10-CM | POA: Insufficient documentation

## 2013-02-12 DIAGNOSIS — IMO0002 Reserved for concepts with insufficient information to code with codable children: Secondary | ICD-10-CM | POA: Insufficient documentation

## 2013-02-12 DIAGNOSIS — Y92009 Unspecified place in unspecified non-institutional (private) residence as the place of occurrence of the external cause: Secondary | ICD-10-CM | POA: Insufficient documentation

## 2013-02-12 DIAGNOSIS — M129 Arthropathy, unspecified: Secondary | ICD-10-CM | POA: Insufficient documentation

## 2013-02-12 MED ORDER — OXYCODONE-ACETAMINOPHEN 5-325 MG PO TABS: 1.0000 | ORAL_TABLET | ORAL | Status: DC | PRN

## 2013-02-12 MED ORDER — FENTANYL CITRATE 0.05 MG/ML IJ SOLN
50.0000 ug | Freq: Once | INTRAMUSCULAR | Status: AC
  Administered 2013-02-12: 50 ug via INTRAVENOUS
  Filled 2013-02-12: qty 2

## 2013-02-12 MED ORDER — IBUPROFEN 600 MG PO TABS: 600.0000 mg | ORAL_TABLET | Freq: Four times a day (QID) | ORAL | Status: DC | PRN

## 2013-02-12 MED ORDER — FENTANYL CITRATE 0.05 MG/ML IJ SOLN
50.0000 ug | INTRAMUSCULAR | Status: DC | PRN
  Administered 2013-02-12: 50 ug via INTRAVENOUS
  Filled 2013-02-12: qty 2

## 2013-02-12 NOTE — ED Provider Notes (Signed)
CSN: [id number]     Arrival date & time 02/12/13  0004 History   First MD Initiated Contact with Patient 02/12/13 0004     Chief Complaint  Patient presents with  . Shoulder Pain   (Consider location/radiation/quality/duration/timing/severity/associated sxs/prior Treatment) HPI This patient is a 68 year old woman who presents with complaints of right shoulder and upper arm pain following a fall. The patient tripped and fell against a door frame striking her right upper arm and shoulder. She denies loss of consciousness. She denies pain or injury to any other region.  The patient reports 9/10 pain which is aching and worse with any attempts to move the right arm. She was treated with 50 mcg of fentanyl by paramedics on route. She denies paresthesias and motor weakness.. Past Medical History  Diagnosis Date  . Arthritis    Past Surgical History  Procedure Laterality Date  . Joint replacement      Left TKR.   No family history on file. History  Substance Use Topics  . Smoking status: Never Smoker   . Smokeless tobacco: Not on file  . Alcohol Use: No   OB History   Grav Para Term Preterm Abortions TAB SAB Ect Mult Living                 Review of Systems Ten point review of symptoms performed and is negative with the exception of symptoms noted above.   Allergies  Review of patient's allergies indicates no known allergies.  Home Medications   Current Outpatient Rx  Name  Route  Sig  Dispense  Refill  . aspirin 325 MG EC tablet   Oral   Take 325 mg by mouth daily.         . fish oil-omega-3 fatty acids 1000 MG capsule   Oral   Take 1 g by mouth daily.          Marland Kitchen. glucosamine-chondroitin 500-400 MG tablet   Oral   Take 1 tablet by mouth 3 (three) times daily.         Marland Kitchen. lisinopril (PRINIVIL,ZESTRIL) 20 MG tablet   Oral   Take 20 mg by mouth daily.          . Multiple Vitamins-Minerals (MULTIVITAMIN WITH MINERALS) tablet   Oral   Take 1 tablet by mouth  daily.         . Naproxen Sodium (ALEVE PO)   Oral   Take 1 tablet by mouth 2 (two) times daily as needed (knee pain).         Marland Kitchen. ibuprofen (ADVIL,MOTRIN) 600 MG tablet   Oral   Take 1 tablet (600 mg total) by mouth every 6 (six) hours as needed.   30 tablet   0   . oxyCODONE-acetaminophen (PERCOCET) 5-325 MG per tablet   Oral   Take 1-2 tablets by mouth every 4 (four) hours as needed.   30 tablet   0    BP 110/74  Pulse 79  Temp(Src) 98.2 F (36.8 C) (Oral)  Resp 18  SpO2 99% Physical Exam Gen: well developed and well nourished appearing, appears uncomfortable Head: NCAT Eyes: PERL, EOMI Nose: no epistaixis or rhinorrhea Mouth/throat: mucosa is moist and pink, no intraoral trauma Neck: supple, no stridor, no C-spine tenderness Lungs: CTA B, no wheezing, rhonchi or rales CV: RRR, no murmur, extremities appear well perfused.  Abd: soft, notender, obese Back: No midline tenderness Skin: warm and dry Ext: There is deformity of the proximal right arm with  exquisite tenderness in this region. The elbow forearm wrist and hand are nontender. There are brisk and symmetric radial pulses. Patient has normal cap refill in all fingers and sensation is intact to light touch throughout the right hand. The left arm, left and right leg are normal to inspection and without tenderness Neuro: CN ii-xii grossly intact, no focal deficits Psyche; appropriately anxious affect,  calm and cooperative.   ED Course  Procedures (including critical care time) Labs Review Labs Reviewed - No data to display Imaging Review Dg Shoulder 1v Right  02/12/2013   CLINICAL DATA:  Status post fall; right shoulder pain.  EXAM: RIGHT SHOULDER - 1 VIEW  COMPARISON:  MRI of the right shoulder performed 05/28/2006  FINDINGS: There is a significantly displaced fracture involving the proximal right humeral diaphysis, with approximately 3 cm of shortening and 1 shaft width medial displacement. Associated  angulation is noted. The right humeral head remains seated at the glenoid fossa. The right acromioclavicular joint demonstrates mild degenerative change but is otherwise unremarkable. The right lung appears grossly clear, with suggestion of mild vascular congestion.  IMPRESSION: Significantly displaced fracture involving the proximal right humeral diaphysis, with approximately 3 cm of shortening and 1 shaft width medial displacement. Associated angulation seen.   Electronically Signed   By: Roanna Raider M.D.   On: 02/12/2013 01:11   Dg Humerus Right  02/12/2013   CLINICAL DATA:  Status post trauma; right arm pain.  EXAM: RIGHT HUMERUS - 2+ VIEW  COMPARISON:  MRI of the right shoulder performed 05/28/2006  FINDINGS: As described on the concurrent right shoulder radiograph, there is a significantly displaced fracture involving the proximal right humeral diaphysis, with approximately 3 cm of shortening and 1 shaft width medial displacement. Associated angulation is noted. The right humeral head remains seated at the glenoid fossa. The distal humerus appears intact. The elbow joint is not fully assessed, but appears grossly unremarkable.  Mild degenerative change is noted at the right acromioclavicular joint. No definite soft tissue abnormalities are characterized on radiograph.  IMPRESSION: Significantly displaced fracture involving the proximal right humeral diaphysis, with 3 cm of shortening and 1 shaft width medial displacement. Associated angulation seen.   Electronically Signed   By: Roanna Raider M.D.   On: 02/12/2013 01:17   MDM   1. Fracture, humerus, shaft    Patient was significantly angulated and displaced fracture of the right humerus and shaft. I discussed the case with Dr. Magnus Ivan who was reviewed the patient's x-rays. He recommends a coaptation sling. He will see the patient on Monday. I've discussed pain management and ice and elevation with the patient and her daughter. Will prescribe  ibuprofen and Percocet #30 tablets.    Brandt Loosen, MD 02/12/13 [phone]

## 2013-02-12 NOTE — ED Notes (Signed)
[facility]  Pt stepped on a toy in her house and fell against door facing.  C/o pain to R shoulder.  EMS administered Fentanyl 50 mcg IV and pain decreased from 10/10 to 6/10.  Pt declines additional pain medication at this time.  Alert and oriented.  Denies LOC.  Denies neck and back pain.

## 2013-02-12 NOTE — Discharge Instructions (Signed)
Humerus Fracture, Treated with Immobilization The humerus is the large bone in your upper arm. You have a broken (fractured) humerus. These fractures are easily diagnosed with X-rays. TREATMENT  Simple fractures which will heal without disability are treated with simple immobilization. Immobilization means you will wear a cast, splint, or sling. You have a fracture which will do well with immobilization. The fracture will heal well simply by being held in a good position until it is stable enough to begin range of motion exercises. Do not take part in activities which would further injure your arm.  HOME CARE INSTRUCTIONS   Put ice on the injured area.  Put ice in a plastic bag.  Place a towel between your skin and the bag.  Leave the ice on for 15-20 minutes, 03-04 times a day.  If you have a cast:  Do not scratch the skin under the cast using sharp or pointed objects.  Check the skin around the cast every day. You may put lotion on any red or sore areas.  Keep your cast dry and clean.  If you have a splint:  Wear the splint as directed.  Keep your splint dry and clean.  You may loosen the elastic around the splint if your fingers become numb, tingle, or turn cold or blue.  If you have a sling:  Wear the sling as directed.  Do not put pressure on any part of your cast or splint until it is fully hardened.  Your cast or splint can be protected during bathing with a plastic bag. Do not lower the cast or splint into water.  Only take over-the-counter or prescription medicines for pain, discomfort, or fever as directed by your caregiver.  Do range of motion exercises as instructed by your caregiver.  Follow up as directed by your caregiver. This is very important in order to avoid permanent injury or disability and chronic pain. SEEK IMMEDIATE MEDICAL CARE IF:   Your skin or nails in the injured arm turn blue or gray.  Your arm feels cold or numb.  You develop severe  pain in the injured arm.  You are having problems with the medicines you were given. MAKE SURE YOU:   Understand these instructions.  Will watch your condition.  Will get help right away if you are not doing well or get worse. Document Released: 04/14/2000 Document Revised: 03/31/2011 Document Reviewed: 02/20/2010 [facility] Patient Information 2014 [employer], [place].   ICE PACKS FREQUENTLY TO AFFECTED ARM.

## 2013-02-12 NOTE — ED Notes (Signed)
Dr. Lavella LemonsManly at bedside on EMS arrival.

## 2013-02-12 NOTE — ED Notes (Signed)
Pt to xray

## 2013-02-12 NOTE — Progress Notes (Signed)
Orthopedic Tech Progress Note Patient Details:  Stacy Wilkinson November 06, 1945 [id number]  Ortho Devices Type of Ortho Device: Arm sling;Sugartong splint   Haskell Flirtewsome, [name] M 02/12/2013, 3:41 AM

## 2013-02-14 ENCOUNTER — Encounter (HOSPITAL_COMMUNITY): Payer: Self-pay | Admitting: Pharmacy Technician

## 2013-02-14 DIAGNOSIS — S42209A Unspecified fracture of upper end of unspecified humerus, initial encounter for closed fracture: Secondary | ICD-10-CM | POA: Diagnosis not present

## 2013-02-16 DIAGNOSIS — S42309D Unspecified fracture of shaft of humerus, unspecified arm, subsequent encounter for fracture with routine healing: Secondary | ICD-10-CM | POA: Diagnosis not present

## 2013-02-17 ENCOUNTER — Encounter (HOSPITAL_BASED_OUTPATIENT_CLINIC_OR_DEPARTMENT_OTHER): Payer: Self-pay | Admitting: *Deleted

## 2013-02-17 ENCOUNTER — Encounter (HOSPITAL_BASED_OUTPATIENT_CLINIC_OR_DEPARTMENT_OTHER)
Admission: RE | Admit: 2013-02-17 | Discharge: 2013-02-17 | Disposition: A | Discharge status: Home / Self Care | Payer: Medicare Other | Source: Ambulatory Visit | Attending: Orthopedic Surgery | Admitting: Orthopedic Surgery

## 2013-02-17 ENCOUNTER — Other Ambulatory Visit: Payer: Self-pay

## 2013-02-17 DIAGNOSIS — I10 Essential (primary) hypertension: Secondary | ICD-10-CM | POA: Diagnosis not present

## 2013-02-17 DIAGNOSIS — Z01810 Encounter for preprocedural cardiovascular examination: Secondary | ICD-10-CM | POA: Diagnosis not present

## 2013-02-17 DIAGNOSIS — Z01812 Encounter for preprocedural laboratory examination: Secondary | ICD-10-CM | POA: Diagnosis not present

## 2013-02-17 DIAGNOSIS — S42309A Unspecified fracture of shaft of humerus, unspecified arm, initial encounter for closed fracture: Secondary | ICD-10-CM | POA: Diagnosis not present

## 2013-02-17 DIAGNOSIS — Z7982 Long term (current) use of aspirin: Secondary | ICD-10-CM | POA: Diagnosis not present

## 2013-02-17 LAB — BASIC METABOLIC PANEL
BUN: 17 mg/dL (ref 6–23)
CALCIUM: 9.3 mg/dL (ref 8.4–10.5)
CO2: 26 meq/L (ref 19–32)
CREATININE: 0.52 mg/dL (ref 0.50–1.10)
Chloride: 105 mEq/L (ref 96–112)
Glucose, Bld: 97 mg/dL (ref 70–99)
Potassium: 4.6 mEq/L (ref 3.7–5.3)
Sodium: 144 mEq/L (ref 137–147)

## 2013-02-17 NOTE — Progress Notes (Signed)
02/17/13 0936  OBSTRUCTIVE SLEEP APNEA  Have you ever been diagnosed with sleep apnea through a sleep study? No  Do you snore loudly (loud enough to be heard through closed doors)?  0  Do you often feel tired, fatigued, or sleepy during the daytime? 0  Has anyone observed you stop breathing during your sleep? 0  Do you have, or are you being treated for high blood pressure? 1  BMI more Jessey 35 kg/m2? 1  Age over 66 years old? 1  Neck circumference greater [place] 40 cm/18 inches? 1  Gender: 0  Obstructive Sleep Apnea Score 4  Score 4 or greater  Results sent to PCP

## 2013-02-17 NOTE — Progress Notes (Signed)
Pt english very good-lives with son-a friend with her today-will come in for ekg bmet-denies any cardiac or resp problems

## 2013-02-18 ENCOUNTER — Encounter (HOSPITAL_BASED_OUTPATIENT_CLINIC_OR_DEPARTMENT_OTHER): Payer: Medicare Other | Admitting: Anesthesiology

## 2013-02-18 ENCOUNTER — Encounter (HOSPITAL_BASED_OUTPATIENT_CLINIC_OR_DEPARTMENT_OTHER)
Admission: RE | Disposition: A | Discharge status: Home / Self Care | Payer: Self-pay | Source: Ambulatory Visit | Attending: Orthopedic Surgery

## 2013-02-18 ENCOUNTER — Ambulatory Visit (HOSPITAL_COMMUNITY): Payer: Medicare Other

## 2013-02-18 ENCOUNTER — Encounter (HOSPITAL_BASED_OUTPATIENT_CLINIC_OR_DEPARTMENT_OTHER): Payer: Self-pay | Admitting: *Deleted

## 2013-02-18 ENCOUNTER — Ambulatory Visit (HOSPITAL_BASED_OUTPATIENT_CLINIC_OR_DEPARTMENT_OTHER)
Admission: RE | Admit: 2013-02-18 | Discharge: 2013-02-19 | Disposition: A | Discharge status: Home / Self Care | Payer: Medicare Other | Source: Ambulatory Visit | Attending: Orthopedic Surgery | Admitting: Orthopedic Surgery

## 2013-02-18 ENCOUNTER — Ambulatory Visit (HOSPITAL_BASED_OUTPATIENT_CLINIC_OR_DEPARTMENT_OTHER): Payer: Medicare Other | Admitting: Anesthesiology

## 2013-02-18 DIAGNOSIS — S42201A Unspecified fracture of upper end of right humerus, initial encounter for closed fracture: Secondary | ICD-10-CM

## 2013-02-18 DIAGNOSIS — S42209A Unspecified fracture of upper end of unspecified humerus, initial encounter for closed fracture: Secondary | ICD-10-CM | POA: Diagnosis not present

## 2013-02-18 DIAGNOSIS — X58XXXA Exposure to other specified factors, initial encounter: Secondary | ICD-10-CM | POA: Insufficient documentation

## 2013-02-18 DIAGNOSIS — Z7982 Long term (current) use of aspirin: Secondary | ICD-10-CM | POA: Insufficient documentation

## 2013-02-18 DIAGNOSIS — IMO0002 Reserved for concepts with insufficient information to code with codable children: Secondary | ICD-10-CM | POA: Diagnosis not present

## 2013-02-18 DIAGNOSIS — S42309A Unspecified fracture of shaft of humerus, unspecified arm, initial encounter for closed fracture: Secondary | ICD-10-CM | POA: Diagnosis not present

## 2013-02-18 DIAGNOSIS — S42301A Unspecified fracture of shaft of humerus, right arm, initial encounter for closed fracture: Secondary | ICD-10-CM | POA: Diagnosis present

## 2013-02-18 DIAGNOSIS — Z01810 Encounter for preprocedural cardiovascular examination: Secondary | ICD-10-CM | POA: Diagnosis not present

## 2013-02-18 DIAGNOSIS — Z01812 Encounter for preprocedural laboratory examination: Secondary | ICD-10-CM | POA: Insufficient documentation

## 2013-02-18 DIAGNOSIS — I10 Essential (primary) hypertension: Secondary | ICD-10-CM | POA: Insufficient documentation

## 2013-02-18 DIAGNOSIS — S42213A Unspecified displaced fracture of surgical neck of unspecified humerus, initial encounter for closed fracture: Secondary | ICD-10-CM | POA: Diagnosis not present

## 2013-02-18 HISTORY — DX: Essential (primary) hypertension: I10

## 2013-02-18 HISTORY — PX: ORIF HUMERUS FRACTURE: SHX2126

## 2013-02-18 HISTORY — DX: Unspecified fracture of upper end of right humerus, initial encounter for closed fracture: S42.201A

## 2013-02-18 LAB — POCT HEMOGLOBIN-HEMACUE: Hemoglobin: 13.5 g/dL (ref 12.0–15.0)

## 2013-02-18 SURGERY — OPEN REDUCTION INTERNAL FIXATION (ORIF) PROXIMAL HUMERUS FRACTURE
Anesthesia: General | Site: Arm Upper | Laterality: Right

## 2013-02-18 MED ORDER — SODIUM CHLORIDE 0.9 % IV SOLN
INTRAVENOUS | Status: DC
  Administered 2013-02-18: 75 mL/h via INTRAVENOUS

## 2013-02-18 MED ORDER — CEFAZOLIN SODIUM-DEXTROSE 2-3 GM-% IV SOLR
2.0000 g | Freq: Four times a day (QID) | INTRAVENOUS | Status: AC
  Administered 2013-02-18 – 2013-02-19 (×3): 2 g via INTRAVENOUS

## 2013-02-18 MED ORDER — PROMETHAZINE HCL 25 MG PO TABS: 25.0000 mg | ORAL_TABLET | Freq: Four times a day (QID) | ORAL | Status: DC | PRN

## 2013-02-18 MED ORDER — HYDROMORPHONE HCL PF 1 MG/ML IJ SOLN
0.2500 mg | INTRAMUSCULAR | Status: DC | PRN
  Administered 2013-02-18: 0.25 mg via INTRAVENOUS

## 2013-02-18 MED ORDER — METHOCARBAMOL 500 MG PO TABS: 500.0000 mg | ORAL_TABLET | Freq: Four times a day (QID) | ORAL | Status: DC

## 2013-02-18 MED ORDER — ASPIRIN EC 325 MG PO TBEC: 325.0000 mg | DELAYED_RELEASE_TABLET | Freq: Every day | ORAL | Status: DC

## 2013-02-18 MED ORDER — PHENYLEPHRINE HCL 10 MG/ML IJ SOLN
INTRAMUSCULAR | Status: DC | PRN
  Administered 2013-02-18: 40 ug via INTRAVENOUS

## 2013-02-18 MED ORDER — RIVAROXABAN 10 MG PO TABS: 10.0000 mg | ORAL_TABLET | Freq: Every day | ORAL | Status: DC

## 2013-02-18 MED ORDER — MIDAZOLAM HCL 2 MG/2ML IJ SOLN
INTRAMUSCULAR | Status: AC
  Filled 2013-02-18: qty 2

## 2013-02-18 MED ORDER — CEFAZOLIN SODIUM-DEXTROSE 2-3 GM-% IV SOLR
INTRAVENOUS | Status: AC
  Filled 2013-02-18: qty 50

## 2013-02-18 MED ORDER — FENTANYL CITRATE 0.05 MG/ML IJ SOLN
INTRAMUSCULAR | Status: AC
  Filled 2013-02-18: qty 6

## 2013-02-18 MED ORDER — NEOSTIGMINE METHYLSULFATE 1 MG/ML IJ SOLN
INTRAMUSCULAR | Status: DC | PRN
  Administered 2013-02-18: 3 mg via INTRAVENOUS

## 2013-02-18 MED ORDER — PROPOFOL 10 MG/ML IV BOLUS
INTRAVENOUS | Status: AC
  Filled 2013-02-18: qty 20

## 2013-02-18 MED ORDER — OXYCODONE-ACETAMINOPHEN 10-325 MG PO TABS: 1.0000 | ORAL_TABLET | Freq: Four times a day (QID) | ORAL | Status: DC | PRN

## 2013-02-18 MED ORDER — ONDANSETRON HCL 4 MG/2ML IJ SOLN: 4.0000 mg | Freq: Once | INTRAMUSCULAR | Status: DC | PRN

## 2013-02-18 MED ORDER — OXYCODONE HCL 5 MG/5ML PO SOLN: 5.0000 mg | Freq: Once | ORAL | Status: DC | PRN

## 2013-02-18 MED ORDER — OXYCODONE HCL 5 MG PO TABS: 5.0000 mg | ORAL_TABLET | Freq: Once | ORAL | Status: DC | PRN

## 2013-02-18 MED ORDER — PROPOFOL 10 MG/ML IV BOLUS
INTRAVENOUS | Status: DC | PRN
  Administered 2013-02-18: 150 mg via INTRAVENOUS

## 2013-02-18 MED ORDER — FENTANYL CITRATE 0.05 MG/ML IJ SOLN
INTRAMUSCULAR | Status: AC
  Filled 2013-02-18: qty 2

## 2013-02-18 MED ORDER — LACTATED RINGERS IV SOLN
INTRAVENOUS | Status: DC
  Administered 2013-02-18 (×2): via INTRAVENOUS

## 2013-02-18 MED ORDER — METOCLOPRAMIDE HCL 5 MG PO TABS: 5.0000 mg | ORAL_TABLET | Freq: Three times a day (TID) | ORAL | Status: DC | PRN

## 2013-02-18 MED ORDER — FENTANYL CITRATE 0.05 MG/ML IJ SOLN
INTRAMUSCULAR | Status: DC | PRN
  Administered 2013-02-18: 50 ug via INTRAVENOUS

## 2013-02-18 MED ORDER — HYDROCODONE-ACETAMINOPHEN 10-325 MG PO TABS: 1.0000 | ORAL_TABLET | ORAL | Status: DC | PRN

## 2013-02-18 MED ORDER — DOCUSATE SODIUM 100 MG PO CAPS
100.0000 mg | ORAL_CAPSULE | Freq: Two times a day (BID) | ORAL | Status: DC
  Administered 2013-02-18: 100 mg via ORAL
  Filled 2013-02-18: qty 1

## 2013-02-18 MED ORDER — MIDAZOLAM HCL 5 MG/5ML IJ SOLN
INTRAMUSCULAR | Status: DC | PRN
  Administered 2013-02-18: 1 mg via INTRAVENOUS

## 2013-02-18 MED ORDER — LISINOPRIL 20 MG PO TABS: 20.0000 mg | ORAL_TABLET | Freq: Every day | ORAL | Status: DC

## 2013-02-18 MED ORDER — ZOLPIDEM TARTRATE 5 MG PO TABS: 5.0000 mg | ORAL_TABLET | Freq: Every evening | ORAL | Status: DC | PRN

## 2013-02-18 MED ORDER — ONDANSETRON HCL 4 MG/2ML IJ SOLN
INTRAMUSCULAR | Status: DC | PRN
  Administered 2013-02-18: 4 mg via INTRAVENOUS

## 2013-02-18 MED ORDER — ONDANSETRON HCL 4 MG PO TABS: 4.0000 mg | ORAL_TABLET | Freq: Four times a day (QID) | ORAL | Status: DC | PRN

## 2013-02-18 MED ORDER — SENNA 8.6 MG PO TABS
1.0000 | ORAL_TABLET | Freq: Two times a day (BID) | ORAL | Status: DC
Start: 2013-02-18 — End: 2013-02-19
  Administered 2013-02-18: 8.6 mg via ORAL
  Filled 2013-02-18: qty 1

## 2013-02-18 MED ORDER — METHOCARBAMOL 100 MG/ML IJ SOLN: 500.0000 mg | Freq: Four times a day (QID) | INTRAVENOUS | Status: DC | PRN

## 2013-02-18 MED ORDER — EPHEDRINE SULFATE 50 MG/ML IJ SOLN
INTRAMUSCULAR | Status: DC | PRN
  Administered 2013-02-18: 15 mg via INTRAVENOUS
  Administered 2013-02-18: 10 mg via INTRAVENOUS

## 2013-02-18 MED ORDER — ROCURONIUM BROMIDE 100 MG/10ML IV SOLN
INTRAVENOUS | Status: DC | PRN
  Administered 2013-02-18: 40 mg via INTRAVENOUS

## 2013-02-18 MED ORDER — FENTANYL CITRATE 0.05 MG/ML IJ SOLN
50.0000 ug | INTRAMUSCULAR | Status: DC | PRN
  Administered 2013-02-18: 50 ug via INTRAVENOUS

## 2013-02-18 MED ORDER — ONDANSETRON HCL 4 MG/2ML IJ SOLN: 4.0000 mg | Freq: Four times a day (QID) | INTRAMUSCULAR | Status: DC | PRN

## 2013-02-18 MED ORDER — HYDROMORPHONE HCL PF 1 MG/ML IJ SOLN
INTRAMUSCULAR | Status: AC
  Filled 2013-02-18: qty 1

## 2013-02-18 MED ORDER — LIDOCAINE HCL (CARDIAC) 20 MG/ML IV SOLN
INTRAVENOUS | Status: DC | PRN
  Administered 2013-02-18: 50 mg via INTRAVENOUS

## 2013-02-18 MED ORDER — GLYCOPYRROLATE 0.2 MG/ML IJ SOLN
INTRAMUSCULAR | Status: DC | PRN
  Administered 2013-02-18: .4 mg via INTRAVENOUS

## 2013-02-18 MED ORDER — HYDROMORPHONE HCL PF 1 MG/ML IJ SOLN: 0.5000 mg | INTRAMUSCULAR | Status: DC | PRN

## 2013-02-18 MED ORDER — HYDROCODONE-ACETAMINOPHEN 5-325 MG PO TABS
1.0000 | ORAL_TABLET | ORAL | Status: DC | PRN
  Administered 2013-02-18: 2 via ORAL
  Filled 2013-02-18: qty 2

## 2013-02-18 MED ORDER — MIDAZOLAM HCL 2 MG/2ML IJ SOLN
1.0000 mg | INTRAMUSCULAR | Status: DC | PRN
  Administered 2013-02-18: 1 mg via INTRAVENOUS

## 2013-02-18 MED ORDER — METOCLOPRAMIDE HCL 5 MG/ML IJ SOLN: 5.0000 mg | Freq: Three times a day (TID) | INTRAMUSCULAR | Status: DC | PRN

## 2013-02-18 MED ORDER — METHOCARBAMOL 500 MG PO TABS
500.0000 mg | ORAL_TABLET | Freq: Four times a day (QID) | ORAL | Status: DC | PRN
  Administered 2013-02-18: 500 mg via ORAL
  Filled 2013-02-18: qty 1

## 2013-02-18 MED ORDER — DEXAMETHASONE SODIUM PHOSPHATE 4 MG/ML IJ SOLN
INTRAMUSCULAR | Status: DC | PRN
  Administered 2013-02-18: 10 mg via INTRAVENOUS

## 2013-02-18 MED ORDER — DEXTROSE 5 % IV SOLN
3.0000 g | INTRAVENOUS | Status: DC | PRN
  Administered 2013-02-18: 3 g via INTRAVENOUS

## 2013-02-18 SURGICAL SUPPLY — 81 items
BANDAGE ELASTIC 4 VELCRO ST LF (GAUZE/BANDAGES/DRESSINGS) ×3 IMPLANT
BENZOIN TINCTURE PRP APPL 2/3 (GAUZE/BANDAGES/DRESSINGS) ×3 IMPLANT
BIT DRILL 2.9 SHORT NS (BIT) ×1
BIT DRILL 2.9MM SHORT NS (BIT) ×1 IMPLANT
BIT DRILL 3.8 (BIT) ×2
BIT DRILL 3.8XNS DISP GRN (BIT) ×1 IMPLANT
BIT DRL 3.8XNS DISP GRN (BIT) ×1
BLADE SURG 10 STRL SS (BLADE) ×3 IMPLANT
BLADE SURG 15 STRL LF DISP TIS (BLADE) ×1 IMPLANT
BLADE SURG 15 STRL SS (BLADE) ×2
CANISTER SUCT 3000ML (MISCELLANEOUS) IMPLANT
CANISTER SUCTION 1200CC (MISCELLANEOUS) ×3 IMPLANT
CLEANER CAUTERY TIP 5X5 PAD (MISCELLANEOUS) IMPLANT
CLOSURE WOUND 1/2 X4 (GAUZE/BANDAGES/DRESSINGS) ×1
DECANTER SPIKE VIAL GLASS SM (MISCELLANEOUS) IMPLANT
DRAPE C-ARM 42X72 X-RAY (DRAPES) ×3 IMPLANT
DRAPE INCISE IOBAN 66X45 STRL (DRAPES) ×3 IMPLANT
DRAPE SURG 17X23 STRL (DRAPES) IMPLANT
DRAPE U 20/CS (DRAPES) ×3 IMPLANT
DRAPE U-SHAPE 47X51 STRL (DRAPES) ×3 IMPLANT
DRAPE U-SHAPE 76X120 STRL (DRAPES) ×6 IMPLANT
DRILL BIT 2.9MM SHORT NS (BIT) ×2
DURAPREP 26ML APPLICATOR (WOUND CARE) ×3 IMPLANT
ELECT REM PT RETURN 9FT ADLT (ELECTROSURGICAL) ×3
ELECTRODE REM PT RTRN 9FT ADLT (ELECTROSURGICAL) ×1 IMPLANT
GAUZE SPONGE 4X4 16PLY XRAY LF (GAUZE/BANDAGES/DRESSINGS) IMPLANT
GAUZE XEROFORM 1X8 LF (GAUZE/BANDAGES/DRESSINGS) IMPLANT
GLOVE BIO SURGEON STRL SZ 6.5 (GLOVE) ×2 IMPLANT
GLOVE BIO SURGEONS STRL SZ 6.5 (GLOVE) ×1
GLOVE BIOGEL PI IND STRL 7.0 (GLOVE) ×4 IMPLANT
GLOVE BIOGEL PI IND STRL 8 (GLOVE) ×2 IMPLANT
GLOVE BIOGEL PI INDICATOR 7.0 (GLOVE) ×8
GLOVE BIOGEL PI INDICATOR 8 (GLOVE) ×4
GLOVE ECLIPSE 6.5 STRL STRAW (GLOVE) ×3 IMPLANT
GLOVE ORTHO TXT STRL SZ7.5 (GLOVE) ×3 IMPLANT
GLOVE SURG ORTHO 8.0 STRL STRW (GLOVE) IMPLANT
GOWN STRL REUS W/ TWL LRG LVL3 (GOWN DISPOSABLE) ×2 IMPLANT
GOWN STRL REUS W/ TWL XL LVL3 (GOWN DISPOSABLE) ×3 IMPLANT
GOWN STRL REUS W/TWL LRG LVL3 (GOWN DISPOSABLE) ×4
GOWN STRL REUS W/TWL XL LVL3 (GOWN DISPOSABLE) ×6 IMPLANT
GUIDEWIRE BALL NOSE 2.0MM (WIRE) ×3 IMPLANT
GUIDEWIRE HUMERAL 2.2MMX711MM (WIRE) ×3 IMPLANT
NAIL HUMERAL 8X220MM (Nail) ×3 IMPLANT
NS IRRIG 1000ML POUR BTL (IV SOLUTION) ×6 IMPLANT
PACK ARTHROSCOPY DSU (CUSTOM PROCEDURE TRAY) ×3 IMPLANT
PACK BASIN DAY SURGERY FS (CUSTOM PROCEDURE TRAY) ×3 IMPLANT
PAD ABD 8X10 STRL (GAUZE/BANDAGES/DRESSINGS) ×3 IMPLANT
PAD CLEANER CAUTERY TIP 5X5 (MISCELLANEOUS)
PENCIL BUTTON HOLSTER BLD 10FT (ELECTRODE) ×3 IMPLANT
PIN GUIDE ACE (PIN) ×3 IMPLANT
SCREW ACECAP 46MM (Screw) ×3 IMPLANT
SCREW CORTICAL 3.5X22MM (Screw) ×3 IMPLANT
SCREWDRIVER HEX TIP 3.5MM (MISCELLANEOUS) ×3 IMPLANT
SLEEVE SCD COMPRESS KNEE MED (MISCELLANEOUS) ×3 IMPLANT
SLING ARM IMMOBILIZER LRG (SOFTGOODS) IMPLANT
SLING ARM IMMOBILIZER MED (SOFTGOODS) IMPLANT
SLING ARM LRG ADULT FOAM STRAP (SOFTGOODS) ×3 IMPLANT
SLING ARM MED ADULT FOAM STRAP (SOFTGOODS) IMPLANT
SLING ARM XL FOAM STRAP (SOFTGOODS) IMPLANT
SPONGE GAUZE 4X4 12PLY (GAUZE/BANDAGES/DRESSINGS) ×3 IMPLANT
SPONGE LAP 18X18 X RAY DECT (DISPOSABLE) ×3 IMPLANT
STRIP CLOSURE SKIN 1/2X4 (GAUZE/BANDAGES/DRESSINGS) ×2 IMPLANT
SUCTION FRAZIER TIP 10 FR DISP (SUCTIONS) ×3 IMPLANT
SUPPORT WRAP ARM LG (MISCELLANEOUS) IMPLANT
SUT FIBERWIRE #2 38 T-5 BLUE (SUTURE)
SUT MNCRL AB 4-0 PS2 18 (SUTURE) ×3 IMPLANT
SUT VIC AB 0 CT1 18XCR BRD 8 (SUTURE) IMPLANT
SUT VIC AB 0 CT1 27 (SUTURE) ×2
SUT VIC AB 0 CT1 27XBRD ANBCTR (SUTURE) ×1 IMPLANT
SUT VIC AB 0 CT1 8-18 (SUTURE)
SUT VIC AB 2-0 SH 27 (SUTURE)
SUT VIC AB 2-0 SH 27XBRD (SUTURE) IMPLANT
SUT VICRYL 3-0 CR8 SH (SUTURE) ×3 IMPLANT
SUTURE FIBERWR #2 38 T-5 BLUE (SUTURE) IMPLANT
SYR BULB 3OZ (MISCELLANEOUS) IMPLANT
SYR BULB IRRIGATION 50ML (SYRINGE) ×3 IMPLANT
TAPE STRIPS DRAPE STRL (GAUZE/BANDAGES/DRESSINGS) IMPLANT
TOWEL OR 17X24 6PK STRL BLUE (TOWEL DISPOSABLE) ×3 IMPLANT
TUBE CONNECTING 20'X1/4 (TUBING) ×1
TUBE CONNECTING 20X1/4 (TUBING) ×2 IMPLANT
YANKAUER SUCT BULB TIP NO VENT (SUCTIONS) ×3 IMPLANT

## 2013-02-18 NOTE — Progress Notes (Signed)
Assisted Dr. Joslin with right, ultrasound guided, interscalene  block. Side rails up, monitors on throughout procedure. See vital signs in flow sheet. Tolerated Procedure well. 

## 2013-02-18 NOTE — Anesthesia Preprocedure Evaluation (Signed)
Anesthesia Evaluation  Patient identified by MRN, date of birth, ID band Patient awake    Reviewed: Allergy & Precautions, H&P , NPO status , Patient's Chart, lab work & pertinent test results  Airway Mallampati: II TM Distance: >3 FB Neck ROM: Full    Dental  (+) Teeth Intact and Dental Advisory Given   Pulmonary  breath sounds clear to auscultation        Cardiovascular hypertension, Rhythm:Regular Rate:Normal     Neuro/Psych    GI/Hepatic   Endo/Other    Renal/GU      Musculoskeletal   Abdominal   Peds  Hematology   Anesthesia Other Findings   Reproductive/Obstetrics                           Anesthesia Physical Anesthesia Plan  ASA: II  Anesthesia Plan: General   Post-op Pain Management:    Induction: Intravenous  Airway Management Planned: Oral ETT  Additional Equipment:   Intra-op Plan:   Post-operative Plan:   Informed Consent: I have reviewed the patients History and Physical, chart, labs and discussed the procedure including the risks, benefits and alternatives for the proposed anesthesia with the patient or authorized representative who has indicated his/her understanding and acceptance.   Dental advisory given  Plan Discussed with: CRNA and Anesthesiologist  Anesthesia Plan Comments:         Anesthesia Quick Evaluation

## 2013-02-18 NOTE — H&P (Signed)
PREOPERATIVE H&P  Chief Complaint: RIGHT Closed fracture of surgical neck of humerus   HPI: Stacy Wilkinson is a 68 y.o. female who presents for preoperative history and physical with a diagnosis of RIGHT Closed fracture of surgical neck of humerus . Symptoms are rated as moderate to severe, and have been worsening.  This is significantly impairing activities of daily living.  She has elected for surgical management. She wants to have her right knee replaced, and wants to be a we'll use her upper extremity as quickly as possible for weightbearing, and has therefore elected for surgical management.  Past Medical History  Diagnosis Date  . Arthritis   . Hypertension   . Wears glasses    Past Surgical History  Procedure Laterality Date  . Joint replacement  2010    Left TKR.   History   Social History  . Marital Status: Divorced    Spouse Name: N/A    Number of Children: N/A  . Years of Education: N/A   Social History Main Topics  . Smoking status: Never Smoker   . Smokeless tobacco: None  . Alcohol Use: No  . Drug Use: No  . Sexual Activity: No   Other Topics Concern  . None   Social History Narrative  . None   History reviewed. No pertinent family history. No Known Allergies Prior to Admission medications   Medication Sig Start Date End Date Taking? Authorizing Provider  aspirin 325 MG EC tablet Take 325 mg by mouth daily.   Yes Historical Provider, MD  fish oil-omega-3 fatty acids 1000 MG capsule Take 1 g by mouth daily.    Yes Historical Provider, MD  glucosamine-chondroitin 500-400 MG tablet Take 1 tablet by mouth daily.    Yes Historical Provider, MD  ibuprofen (ADVIL,MOTRIN) 600 MG tablet Take 1 tablet (600 mg total) by mouth every 6 (six) hours as needed. 02/12/13  Yes Brandt Loosen, MD  lisinopril (PRINIVIL,ZESTRIL) 20 MG tablet Take 20 mg by mouth daily.  02/08/13  Yes Historical Provider, MD  Multiple Vitamins-Minerals (MULTIVITAMIN WITH MINERALS) tablet Take 1  tablet by mouth daily.   Yes Historical Provider, MD  Naproxen Sodium (ALEVE PO) Take 1 tablet by mouth 2 (two) times daily as needed (knee pain).   Yes Historical Provider, MD  oxyCODONE-acetaminophen (PERCOCET) 5-325 MG per tablet Take 1-2 tablets by mouth every 4 (four) hours as needed. 02/12/13  Yes Brandt Loosen, MD     Positive ROS: All other systems have been reviewed and were otherwise negative with the exception of those mentioned in the HPI and as above.  Physical Exam: General: Alert, no acute distress, obese Cardiovascular: No pedal edema Respiratory: No cyanosis, no use of accessory musculature GI: No organomegaly, abdomen is soft and non-tender Skin: No lesions in the area of chief complaint, with the exception of bruising Neurologic: Sensation intact distally Psychiatric: Patient is competent for consent with normal mood and affect Lymphatic: No axillary or cervical lymphadenopathy  MUSCULOSKELETAL: Right shoulder has tenderness to palpation proximally. Sensation and motor intact throughout the hand. She is unable to abduct the arm.  Assessment: RIGHT Closed fracture of surgical neck of humerus   Plan: Plan for Procedure(s): OPEN TREATMENT RIGHT  PROXIMAL HUMERAL SHAFT FRACTURE WITH INTRAMEDULLARY NAIL  The risks benefits and alternatives were discussed with the patient including but not limited to the risks of nonoperative treatment, versus surgical intervention including infection, bleeding, nerve injury, malunion, nonunion, the need for revision surgery, hardware prominence, hardware failure,  the need for hardware removal, blood clots, cardiopulmonary complications, morbidity, mortality, among others, and they were willing to proceed.     Eulas PostLANDAU,Westlynn Fifer P, MD Cell [phone]   02/18/2013 9:07 AM

## 2013-02-18 NOTE — Discharge Instructions (Signed)
Diet: As you were doing prior to hospitalization  ° °Shower:  May shower but keep the wounds dry, use an occlusive plastic wrap, NO SOAKING IN TUB.  If the bandage gets wet, change with a clean dry gauze. ° °Dressing:  You may change your dressing 3-5 days after surgery.  Then change the dressing daily with sterile gauze dressing.   ° °There are sticky tapes (steri-strips) on your wounds and all the stitches are absorbable.  Leave the steri-strips in place when changing your dressings, they will peel off with time, usually 2-3 weeks. ° °Activity:  Increase activity slowly as tolerated, but follow the weight bearing instructions below.  No lifting or driving for 6 weeks. ° °Weight Bearing:   Sling at all times..   ° °To prevent constipation: you may use a stool softener such as - ° °Colace (over the counter) 100 mg by mouth twice a day  °Drink plenty of fluids (prune juice may be helpful) and high fiber foods °Miralax (over the counter) for constipation as needed.   ° °Itching:  If you experience itching with your medications, try taking only a single pain pill, or even half a pain pill at a time.  You may take up to 10 pain pills per day, and you can also use benadryl over the counter for itching or also to help with sleep.  ° °Precautions:  If you experience chest pain or shortness of breath - call 911 immediately for transfer to the hospital emergency department!! ° °If you develop a fever greater that 101 F, purulent drainage from wound, increased redness or drainage from wound, or calf pain -- Call the office at [phone]                                                °Follow- Up Appointment:  Please call for an appointment to be seen in 2 weeks [place] - [phone] ° ° °Post Anesthesia Home Care Instructions ° °Activity: °Get plenty of rest for the remainder of the day. A responsible adult should stay with you for 24 hours following the procedure.  °For the next 24 hours, DO NOT: °-Drive a  car °-Operate machinery °-Drink alcoholic beverages °-Take any medication unless instructed by your physician °-Make any legal decisions or sign important papers. ° °Meals: °Start with liquid foods such as gelatin or soup. Progress to regular foods as tolerated. Avoid greasy, spicy, heavy foods. If nausea and/or vomiting occur, drink only clear liquids until the nausea and/or vomiting subsides. Call your physician if vomiting continues. ° °Special Instructions/Symptoms: °Your throat may feel dry or sore from the anesthesia or the breathing tube placed in your throat during surgery. If this causes discomfort, gargle with warm salt water. The discomfort should disappear within 24 hours. ° ° °Regional Anesthesia Blocks ° °1. Numbness or the inability to move the "blocked" extremity may last from 3-48 hours after placement. The length of time depends on the medication injected and your individual response to the medication. If the numbness is not going away after 48 hours, call your surgeon. ° °2. The extremity that is blocked will need to be protected until the numbness is gone and the  Strength has returned. Because you cannot feel it, you will need to take extra care to avoid injury. Because it may be weak, you may have difficulty moving   it or using it. You may not know what position it is in without looking at it while the block is in effect. ° °3. For blocks in the legs and feet, returning to weight bearing and walking needs to be done carefully. You will need to wait until the numbness is entirely gone and the strength has returned. You should be able to move your leg and foot normally before you try and bear weight or walk. You will need someone to be with you when you first try to ensure you do not fall and possibly risk injury. ° °4. Bruising and tenderness at the needle site are common side effects and will resolve in a few days. ° °5. Persistent numbness or new problems with movement should be communicated to  the surgeon or the [facility] ([phone])/ [facility] ([phone]). ° ° ° °

## 2013-02-18 NOTE — Anesthesia Postprocedure Evaluation (Signed)
Anesthesia Post Note  Patient: Stacy Wilkinson  Procedure(s) Performed: Procedure(s) (LRB): OPEN TREATMENT RIGHT  HUMERAL SHAFT FRACTURE WITH HUMERAL ROD AND LOCKING SCREWS (Right)  Anesthesia type: General  Patient location: PACU  Post pain: Pain level controlled and Adequate analgesia  Post assessment: Post-op Vital signs reviewed, Patient's Cardiovascular Status Stable, Respiratory Function Stable, Patent Airway and Pain level controlled  Last Vitals:  Filed Vitals:   02/18/13 1505  BP:   Pulse: 74  Temp:   Resp: 16    Post vital signs: Reviewed and stable  Level of consciousness: awake, alert  and oriented  Complications: No apparent anesthesia complications

## 2013-02-18 NOTE — Op Note (Signed)
02/18/2013  2:01 PM  PATIENT:  Stacy Wilkinson    PRE-OPERATIVE DIAGNOSIS:  RIGHT Closed fracture of proximal shaft, humerus   POST-OPERATIVE DIAGNOSIS:  Same  PROCEDURE:  OPEN TREATMENT RIGHT  HUMERAL SHAFT FRACTURE WITH HUMERAL ROD AND LOCKING SCREWS  SURGEON:  Eulas PostLANDAU,Deasia Chiu P, MD  PHYSICIAN ASSISTANT: Janace LittenBrandon Parry, OPA-C, present and scrubbed throughout the case, critical for completion in a timely fashion, and for retraction, instrumentation, and closure.  ANESTHESIA:   General  PREOPERATIVE INDICATIONS:  Stacy Wilkinson is a  68 y.o. female with a diagnosis of RIGHT Closed fracture of proximal shaft, humerus  who was originally scheduled for a total knee replacement, however unfortunately had to be postponed because of her fracture. She was very anxious to get going to have her knee replacement surgery, because of the severe knee pain was limiting her ability to ambulate, and making her nearly bedbound. She has morbid obesity, as well as history of a pulmonary embolus. She was in fair alignment in her coaptation brace, although she very clearly said that she wanted surgery to be able to restore her ability to bear weight through her arm as soon as possible in order to be out of walk on a walker, particularly given that she has difficulty ambulating currently even without the knee replacement, without the use of that extremity. For these reasons she elected for surgical management.    The risks benefits and alternatives were discussed with the patient including but not limited to the risks of nonoperative treatment, versus surgical intervention including infection, bleeding, nerve injury, malunion, nonunion, the need for revision surgery, hardware prominence, hardware failure, the need for hardware removal, blood clots, cardiopulmonary complications, morbidity, mortality, among others, and they were willing to proceed.  We also specifically discussed the risks for axillary nerve injury, radial nerve  injury, malunion, among others.   OPERATIVE IMPLANTS: Biomet Universal humeral nail size 22 mm x 8 mm with a proximal interlocking bolt and distal interlocking bolt.  OPERATIVE FINDINGS: Intact rotator cuff, displaced proximal humerus fracture that was unstable, morbid obesity.  OPERATIVE PROCEDURE: The patient is brought to the operating room and placed in the supine position. General anesthesia was administered. IV antibiotics were given. She was placed in the beach chair position. The right upper extremity was prepped and draped in usual sterile fashion. Time out was performed. Anterior incision was made off of the anterolateral acromion. Dissection was carried down and the anterior third and posterior two thirds of the deltoid were split. The bursa was removed and the rotator cuff identified. Incision was made through the supraspinatus tendon, in line with its fibers, just posterior to the bicipital groove. The tendon was reflected anteriorly and posteriorly, and a guidewire was introduced into the proximal humerus. C-arm was used to confirm location entry point of the guidewire as best as possible, although this is fairly challenging, because couldn't really rotate proximal humerus to get 2 views. Nonetheless I was ultimately satisfied with the location, and used the awl to open the proximal humerus.  I then introduced a guidewire, and reamed sequentially after measuring the length. I reamed up to a size 9. The reaming was done by hand. I had a little bit of chatter, although . her canal still  could have been slightly larger.  I attempted to maintain satisfactory reduction during the reaming. I then exchanged the guidewires, and placed the nail down the shaft of the humerus. This was countersunk at least 2-3 mm below the surface  of the head. I secured the nail proximally using the proximal to distal oblique hole. I took care to try and prevent any soft tissue opposition, or injury to the axillary  nerve. I did feel that I was proximal enough with the start point of that screw that the axillary nerve should not have been in significant danger. I measured the location off of the tip of the acromion, which measured about 5 cm.   I then confirmed satisfactory reduction as best as possible, although I was off to some degree on the lateral view, in my AP view is satisfactory alignment. Both did have satisfactory overall alignment, although I was not able to achieve anatomic alignment, but this is one of the limitations of an indirect reduction, as well as the challenges with a proximal fracture with intramedullary nail.  Nonetheless I was satisfied with the overall position and length, and secured to the nail distally using perfect circles technique. The distal hole was prepared using a blunt spreading technique in order to preserve and protect the radial nerve.   I then irrigated the wounds copiously, and repaired the split in the rotator cuff using 0 Vicryl, with a total of 2 sutures.  The deltoid split was repaired with Vicryl, followed by Vicryl for the subcutaneous tissue and Monocryl for the skin.  I will plan to put her on Xarelto to for a period of 2 weeks postoperatively given her history of DVT/PE.

## 2013-02-18 NOTE — Transfer of Care (Signed)
Immediate Anesthesia Transfer of Care Note  Patient: Stacy Wilkinson  Procedure(s) Performed: Procedure(s): OPEN TREATMENT RIGHT  HUMERAL SHAFT FRACTURE WITH HUMERAL ROD AND LOCKING SCREWS (Right)  Patient Location: PACU  Anesthesia Type:General and Regional  Level of Consciousness: awake, alert  and oriented  Airway & Oxygen Therapy: Patient Spontanous Breathing and Patient connected to face mask oxygen  Post-op Assessment: Report given to PACU RN and Post -op Vital signs reviewed and stable  Post vital signs: Reviewed and stable  Complications: No apparent anesthesia complications

## 2013-02-18 NOTE — Anesthesia Procedure Notes (Addendum)
Anesthesia Regional Block:  Supraclavicular block  Pre-Anesthetic Checklist: ,, timeout performed, Correct Patient, Correct Site, Correct Laterality, Correct Procedure, Correct Position, site marked, Risks and benefits discussed,  Surgical consent,  Pre-op evaluation,  At surgeon's request and post-op pain management  Laterality: Right  Prep: chloraprep       Needles:  Injection technique: Single-shot  Needle Type: Echogenic Stimulator Needle      Needle Gauge: 22 and 22 G    Additional Needles:  Procedures: ultrasound guided (picture in chart) and nerve stimulator Supraclavicular block Narrative:  Start time: 02/18/2013 10:20 AM End time: 02/18/2013 10:25 AM Injection made incrementally with aspirations every 5 mL.  Performed by: Personally   Additional Notes: 30 cc 0.5% marcaine with 1:200 Epi and 8 mg decadron injected easily   Procedure Name: Intubation Date/Time: 02/18/2013 12:23 PM Performed by: Zenia ResidesPAYNE, Zoe Creasman D Pre-anesthesia Checklist: Patient identified, Emergency Drugs available, Suction available and Patient being monitored Patient Re-evaluated:Patient Re-evaluated prior to inductionOxygen Delivery Method: Circle System Utilized Preoxygenation: Pre-oxygenation with 100% oxygen Intubation Type: IV induction Ventilation: Mask ventilation without difficulty Laryngoscope Size: Mac and 3 Grade View: Grade I Tube type: Oral Tube size: 7.0 mm Number of attempts: 1 Airway Equipment and Method: stylet and oral airway Placement Confirmation: ETT inserted through vocal cords under direct vision,  positive ETCO2,  breath sounds checked- equal and bilateral and CO2 detector Secured at: 23 (L LIP) cm Tube secured with: Tape Dental Injury: Teeth and Oropharynx as per pre-operative assessment

## 2013-02-19 DIAGNOSIS — I10 Essential (primary) hypertension: Secondary | ICD-10-CM | POA: Diagnosis not present

## 2013-02-19 DIAGNOSIS — Z01810 Encounter for preprocedural cardiovascular examination: Secondary | ICD-10-CM | POA: Diagnosis not present

## 2013-02-19 DIAGNOSIS — Z01812 Encounter for preprocedural laboratory examination: Secondary | ICD-10-CM | POA: Diagnosis not present

## 2013-02-19 DIAGNOSIS — Z7982 Long term (current) use of aspirin: Secondary | ICD-10-CM | POA: Diagnosis not present

## 2013-02-19 DIAGNOSIS — S42309A Unspecified fracture of shaft of humerus, unspecified arm, initial encounter for closed fracture: Secondary | ICD-10-CM | POA: Diagnosis not present

## 2013-02-19 MED ORDER — ENOXAPARIN SODIUM 40 MG/0.4ML ~~LOC~~ SOLN
SUBCUTANEOUS | Status: AC
  Filled 2013-02-19: qty 0.4

## 2013-02-19 MED ORDER — ENOXAPARIN SODIUM 40 MG/0.4ML ~~LOC~~ SOLN
40.0000 mg | Freq: Two times a day (BID) | SUBCUTANEOUS | Status: AC
  Administered 2013-02-19: 40 mg via SUBCUTANEOUS

## 2013-02-19 MED ORDER — CEFAZOLIN SODIUM-DEXTROSE 2-3 GM-% IV SOLR
INTRAVENOUS | Status: AC
  Filled 2013-02-19: qty 50

## 2013-02-19 NOTE — Discharge Summary (Signed)
Physician Discharge Summary  Patient ID: Orma Renderhan T Dittman MRN: [id number] DOB/AGE: 23-Apr-1945 68 y.o.  Admit date: 02/18/2013 Discharge date: 02/19/2013  Admission Diagnoses:  Fracture of humerus, proximal, right, closed  Discharge Diagnoses:  Principal Problem:   Fracture of humerus, proximal, right, closed Active Problems:   Fracture of right humerus   Past Medical History  Diagnosis Date  . Arthritis   . Hypertension   . Wears glasses   . Fracture of humerus, proximal, right, closed 02/18/2013    Surgeries: Procedure(s): OPEN TREATMENT RIGHT  HUMERAL SHAFT FRACTURE WITH HUMERAL ROD AND LOCKING SCREWS on 02/18/2013   Consultants (if any):    Discharged Condition: Improved  Hospital Course: Jerriyah T Scharlene GlossSint is an 68 y.o. female who was admitted 02/18/2013 with a diagnosis of Fracture of humerus, proximal, right, closed and went to the operating room on 02/18/2013 and underwent the above named procedures.    She was given perioperative antibiotics:  Anti-infectives   Start     Dose/Rate Route Frequency Ordered Stop   02/18/13 1715  ceFAZolin (ANCEF) IVPB 2 g/50 mL premix     2 g 100 mL/hr over 30 Minutes Intravenous Every 6 hours 02/18/13 1704 02/19/13 0604    .  She was given sequential compression devices, early ambulation, and lovenox for DVT prophylaxis. I have ordered xarelto for  her to take after discharge, however her insurance company denied it, required prior authorization, and the prior authorization office is closed. I have ordered Lovenox for her instead, and a require a $300.out of pocket expense. I have counseled the patient that aspirin 325 mg by mouth twice a day may be an option, although it is not as good from a pulmonary embolus protection standpoint. The patient has indicated that she always only takes aspirin, and is planning on utilizing that for DVT prophylaxis. I will try and work on the prior authorization for this or relative to when the insurance company opens  on Monday, and we will give her a dose of Lovenox today, and I will also see whether or not I have any samples in the office of Lovenox that may be able to keep her covered.  Her examination had not returned normal prior to discharge because of block was still in effect, and I did not see any motor function in the axillary or radial nerve or ulnar nerve, and her median nerve had just a flicker of function in the fingers. She did report sensation intact throughout the radial and ulnar nerve and median nerve distributions.  She benefited maximally from the hospital stay and there were no complications.    Recent vital signs:  Filed Vitals:   02/19/13 0800  BP: 93/53  Pulse: 87  Temp: 98.7 F (37.1 C)  Resp: 20    Recent laboratory studies:  Lab Results  Component Value Date   HGB 13.5 02/18/2013   HGB 13.2 01/10/2012   HGB 9.1* 08/21/2008   Lab Results  Component Value Date   WBC 4.8 01/10/2012   PLT 260 08/21/2008   Lab Results  Component Value Date   INR 2.3 10/27/2008   Lab Results  Component Value Date   NA 144 02/17/2013   K 4.6 02/17/2013   CL 105 02/17/2013   CO2 26 02/17/2013   BUN 17 02/17/2013   CREATININE 0.52 02/17/2013   GLUCOSE 97 02/17/2013    Discharge Medications:     Medication List    STOP taking these medications  ALEVE PO     ibuprofen 600 MG tablet  Commonly known as:  ADVIL,MOTRIN     oxyCODONE-acetaminophen 5-325 MG per tablet  Commonly known as:  PERCOCET      TAKE these medications       aspirin 325 MG EC tablet  Take 325 mg by mouth daily.     fish oil-omega-3 fatty acids 1000 MG capsule  Take 1 g by mouth daily.     glucosamine-chondroitin 500-400 MG tablet  Take 1 tablet by mouth daily.     lisinopril 20 MG tablet  Commonly known as:  PRINIVIL,ZESTRIL  Take 20 mg by mouth daily.     methocarbamol 500 MG tablet  Commonly known as:  ROBAXIN  Take 1 tablet (500 mg total) by mouth 4 (four) times daily.     multivitamin with  minerals tablet  Take 1 tablet by mouth daily.     promethazine 25 MG tablet  Commonly known as:  PHENERGAN  Take 1 tablet (25 mg total) by mouth every 6 (six) hours as needed for nausea or vomiting.     rivaroxaban 10 MG Tabs tablet  Commonly known as:  XARELTO  Take 1 tablet (10 mg total) by mouth daily.        Diagnostic Studies: Dg Shoulder 1v Right  02/12/2013   CLINICAL DATA:  Status post fall; right shoulder pain.  EXAM: RIGHT SHOULDER - 1 VIEW  COMPARISON:  MRI of the right shoulder performed 05/28/2006  FINDINGS: There is a significantly displaced fracture involving the proximal right humeral diaphysis, with approximately 3 cm of shortening and 1 shaft width medial displacement. Associated angulation is noted. The right humeral head remains seated at the glenoid fossa. The right acromioclavicular joint demonstrates mild degenerative change but is otherwise unremarkable. The right lung appears grossly clear, with suggestion of mild vascular congestion.  IMPRESSION: Significantly displaced fracture involving the proximal right humeral diaphysis, with approximately 3 cm of shortening and 1 shaft width medial displacement. Associated angulation seen.   Electronically Signed   By: Roanna Raider M.D.   On: 02/12/2013 01:11   Dg Humerus Right  02/18/2013   CLINICAL DATA:  Right humerus fracture.  EXAM: RIGHT HUMERUS - 2+ VIEW; DG C-ARM 1-60 MIN  COMPARISON:  Radiographs dated 02/12/2013  FINDINGS: C-arm images demonstrate the patient has undergone open reduction and internal fixation of the fracture of the proximal right humeral shaft. Intra medullary rod and fixation pins in place. Minimal displacement at the fracture site.  IMPRESSION: Open reduction and internal fixation of proximal right humeral shaft fracture.   Electronically Signed   By: Geanie Cooley M.D.   On: 02/18/2013 15:15   Dg Humerus Right  02/12/2013   CLINICAL DATA:  Status post trauma; right arm pain.  EXAM: RIGHT HUMERUS -  2+ VIEW  COMPARISON:  MRI of the right shoulder performed 05/28/2006  FINDINGS: As described on the concurrent right shoulder radiograph, there is a significantly displaced fracture involving the proximal right humeral diaphysis, with approximately 3 cm of shortening and 1 shaft width medial displacement. Associated angulation is noted. The right humeral head remains seated at the glenoid fossa. The distal humerus appears intact. The elbow joint is not fully assessed, but appears grossly unremarkable.  Mild degenerative change is noted at the right acromioclavicular joint. No definite soft tissue abnormalities are characterized on radiograph.  IMPRESSION: Significantly displaced fracture involving the proximal right humeral diaphysis, with 3 cm of shortening and 1 shaft width medial  displacement. Associated angulation seen.   Electronically Signed   By: Roanna Raider M.D.   On: 02/12/2013 01:17   Dg C-arm 1-60 Min  02/18/2013   CLINICAL DATA:  Right humerus fracture.  EXAM: RIGHT HUMERUS - 2+ VIEW; DG C-ARM 1-60 MIN  COMPARISON:  Radiographs dated 02/12/2013  FINDINGS: C-arm images demonstrate the patient has undergone open reduction and internal fixation of the fracture of the proximal right humeral shaft. Intra medullary rod and fixation pins in place. Minimal displacement at the fracture site.  IMPRESSION: Open reduction and internal fixation of proximal right humeral shaft fracture.   Electronically Signed   By: Geanie Cooley M.D.   On: 02/18/2013 15:15    Disposition: 01-Home or Self Care      Discharge Orders   Future Orders Complete By Expires   Call MD / Call 911  As directed    Comments:     If you experience chest pain or shortness of breath, CALL 911 and be transported to the hospital emergency room.  If you develope a fever above 101 F, pus (white drainage) or increased drainage or redness at the wound, or calf pain, call your surgeon's office.   Constipation Prevention  As directed     Comments:     Drink plenty of fluids.  Prune juice may be helpful.  You may use a stool softener, such as Colace (over the counter) 100 mg twice a day.  Use MiraLax (over the counter) for constipation as needed.   Diet general  As directed    Discharge instructions  As directed    Comments:     Change dressing in 3 days and reapply fresh dressing, unless you have a splint (half cast).  If you have a splint/cast, just leave in place until your follow-up appointment.    Keep wounds dry for 3 weeks.  Leave steri-strips in place on skin.  Do not apply lotion or anything to the wound.      Follow-up Information   Follow up with Eulas Post, MD. Schedule an appointment as soon as possible for a visit in 2 weeks.   Specialty:  Orthopedic Surgery   Contact information:   [address] [place] [address] [phone]        Signed: Eulas Post 02/19/2013, 8:36 AM   Lovenox

## 2013-02-19 NOTE — Progress Notes (Signed)
Patient seen and examined  She is doing well with pain, and the block as still in affect.  On exam she reports having sensation intact throughout the fingers, as well as on the radial aspect of the hand, although her motor function is still very limited, she can barely flex her fingers, does not have intact radial nerve at the thumb. She has extreme difficulty crossing her fingers.  Her dressing is intact, and she grossly reports absence of sensation over the deltoid  Impression: Right proximal humerus fracture status post IM nail, with regional nerve block still functioning, history of pulmonary embolus.  Plan: I have discussed the anticoagulation options with her, and I have called her pharmacy as well as her insurance company. The insurance company has denied the Zarrella toe, and required prior authorization. Their prior authorization office is closed.  The Lovenox has a $300 out of pocket expense, and the patient says that she is unable to pay this. She has indicated that she is always used aspirin in the past, and I have counseled her that she is at higher risk for DVT/pulmonary embolus using aspirin, but she is going to select aspirin as her modality, then I would recommend at least taking two 325 mg aspirin tablets per day.  We will plan to give her 1 dose of Lovenox this morning, and I will leave it up to the patient as to how she wants to proceed with the anticoagulation otherwise.  We will continue to monitor her neurologic status in followup to make sure that her radial and axillary nerves are functional.

## 2013-02-21 ENCOUNTER — Encounter (HOSPITAL_BASED_OUTPATIENT_CLINIC_OR_DEPARTMENT_OTHER): Payer: Self-pay | Admitting: Orthopedic Surgery

## 2013-02-21 ENCOUNTER — Inpatient Hospital Stay (HOSPITAL_COMMUNITY): Admission: RE | Admit: 2013-02-21 | Payer: Self-pay | Source: Ambulatory Visit

## 2013-02-22 ENCOUNTER — Encounter (HOSPITAL_BASED_OUTPATIENT_CLINIC_OR_DEPARTMENT_OTHER): Payer: Self-pay | Admitting: Orthopedic Surgery

## 2013-02-24 ENCOUNTER — Ambulatory Visit: Payer: Self-pay

## 2013-03-03 DIAGNOSIS — S42309D Unspecified fracture of shaft of humerus, unspecified arm, subsequent encounter for fracture with routine healing: Secondary | ICD-10-CM | POA: Diagnosis not present

## 2013-03-04 ENCOUNTER — Ambulatory Visit: Payer: Self-pay

## 2013-03-31 DIAGNOSIS — S42309D Unspecified fracture of shaft of humerus, unspecified arm, subsequent encounter for fracture with routine healing: Secondary | ICD-10-CM | POA: Diagnosis not present

## 2013-04-07 DIAGNOSIS — T8489XA Other specified complication of internal orthopedic prosthetic devices, implants and grafts, initial encounter: Secondary | ICD-10-CM | POA: Diagnosis not present

## 2013-04-07 DIAGNOSIS — T84498A Other mechanical complication of other internal orthopedic devices, implants and grafts, initial encounter: Secondary | ICD-10-CM | POA: Diagnosis not present

## 2013-04-07 DIAGNOSIS — Y831 Surgical operation with implant of artificial internal device as the cause of abnormal reaction of the patient, or of later complication, without mention of misadventure at the time of the procedure: Secondary | ICD-10-CM | POA: Diagnosis not present

## 2013-04-15 ENCOUNTER — Other Ambulatory Visit (HOSPITAL_COMMUNITY): Payer: Medicare Other

## 2013-04-20 DIAGNOSIS — Z4789 Encounter for other orthopedic aftercare: Secondary | ICD-10-CM | POA: Diagnosis not present

## 2013-04-27 ENCOUNTER — Encounter: Payer: Self-pay | Admitting: Physician Assistant

## 2013-04-27 ENCOUNTER — Other Ambulatory Visit: Payer: Self-pay | Admitting: Physician Assistant

## 2013-04-27 DIAGNOSIS — Z4789 Encounter for other orthopedic aftercare: Secondary | ICD-10-CM | POA: Diagnosis not present

## 2013-04-27 NOTE — H&P (Signed)
TOTAL KNEE ADMISSION H&P  Patient is being admitted for right total knee arthroplasty.  Subjective:  Chief Complaint:right knee pain.  HPI: Stacy Wilkinson, 67 y.o. female, has a history of pain and functional disability in the right knee due to arthritis and has failed non-surgical conservative treatments for greater Larisha 12 weeks to includeNSAID's and/or analgesics, corticosteriod injections, viscosupplementation injections, flexibility and strengthening excercises, supervised PT with diminished ADL's post treatment, use of assistive devices and activity modification.  Onset of symptoms was gradual, starting 10 years ago with gradually worsening course since that time. The patient noted no past surgery on the right knee(s).  Patient currently rates pain in the right knee(s) at 10 out of 10 with activity. Patient has night pain, worsening of pain with activity and weight bearing, pain that interferes with activities of daily living, pain with passive range of motion, crepitus and joint swelling.  Patient has evidence of subchondral sclerosis, periarticular osteophytes and joint space narrowing by imaging studies.  There is no active infection.  Patient Active Problem List   Diagnosis Date Noted  . Fracture of humerus, proximal, right, closed 02/18/2013  . Fracture of right humerus 02/18/2013  . PULMONARY EMBOLISM 09/01/2008  . PULMONARY EMBOLISM 08/31/2008  . OSTEOARTHRITIS 08/31/2008   Past Medical History  Diagnosis Date  . Arthritis   . Hypertension   . Wears glasses   . Fracture of humerus, proximal, right, closed 02/18/2013  . Right knee DJD     Past Surgical History  Procedure Laterality Date  . Joint replacement  2010    Left TKR.  . Orif humerus fracture Right 02/18/2013    Procedure: OPEN TREATMENT RIGHT  HUMERAL SHAFT FRACTURE WITH HUMERAL ROD AND LOCKING SCREWS;  Surgeon: Joshua P Landau, MD;  Location: [facility];  Service: Orthopedics;  Laterality: Right;      (Not in a hospital admission) Allergies  Allergen Reactions  . Percocet [Oxycodone-Acetaminophen] Itching    Rash     History  Substance Use Topics  . Smoking status: Never Smoker   . Smokeless tobacco: Not on file  . Alcohol Use: No    No family history on file.   Review of Systems  Constitutional: Negative.   HENT: Negative.   Eyes: Negative.   Respiratory: Negative.   Cardiovascular: Negative.   Gastrointestinal: Negative.   Genitourinary: Negative.   Musculoskeletal: Positive for back pain and joint pain.  Skin: Negative.   Neurological: Negative.   Endo/Heme/Allergies: Negative.   Psychiatric/Behavioral: Negative.     Objective:  Physical Exam  Constitutional: She is oriented to person, place, and time. She appears well-developed and well-nourished.  HENT:  Head: Normocephalic and atraumatic.  Eyes: Conjunctivae and EOM are normal. Pupils are equal, round, and reactive to light.  Neck: Neck supple.  Cardiovascular: Normal rate, regular rhythm and normal heart sounds.   Respiratory: Effort normal and breath sounds normal.  GI: Soft. Bowel sounds are normal.  Genitourinary:  Not pertinent to current symptomatology therefore not examined.  Musculoskeletal:  Examination of her right knee reveals pain posterolaterally with 1+ effusion.  Range of motion -5 to 120 degrees.  Knee is stable with normal patella tracking and diffuse pain.  Examination of the left knee reveals a well healed total knee incision without swelling or pain.  Full range of motion.  Knee is stable with normal patella tracking.  Vascular exam: Pulses are 2+ and symmetric.  Right leg crush injury in 2005.  Took 1 and 1/2   years to heal.  Moderate venous stasis  Neurological: She is alert and oriented to person, place, and time.  Skin: Skin is warm and dry.  Psychiatric: She has a normal mood and affect. Her behavior is normal.    Vital signs in last 24 hours: @VSRANGES @  Labs:   Estimated  body mass index is 44.61 kg/(m^2) as calculated from the following:   Height as of this encounter: 5\' 4"  (1.626 m).   Weight as of this encounter: 117.935 kg (260 lb).   Imaging Review Plain radiographs demonstrate severe degenerative joint disease of the right knee(s). The overall alignment issignificant varus. The bone quality appears to be good for age and reported activity level.  Assessment/Plan:  End stage arthritis, right knee   The patient history, physical examination, clinical judgment of the provider and imaging studies are consistent with end stage degenerative joint disease of the right knee(s) and total knee arthroplasty is deemed medically necessary. The treatment options including medical management, injection therapy arthroscopy and arthroplasty were discussed at length. The risks and benefits of total knee arthroplasty were presented and reviewed. The risks due to aseptic loosening, infection, stiffness, patella tracking problems, thromboembolic complications and other imponderables were discussed. The patient acknowledged the explanation, agreed to proceed with the plan and consent was signed. Patient is being admitted for inpatient treatment for surgery, pain control, PT, OT, prophylactic antibiotics, VTE prophylaxis, progressive ambulation and ADL's and discharge planning. The patient is planning to be discharged home with home health services  Myrtle Barnhard A. Gwinda PasseShepperson, PA-C Physician Assistant [facility] [phone]  04/27/2013, 1:57 PM

## 2013-04-29 ENCOUNTER — Ambulatory Visit (HOSPITAL_COMMUNITY)
Admission: RE | Admit: 2013-04-29 | Discharge: 2013-04-29 | Disposition: A | Discharge status: Home / Self Care | Payer: Medicare Other | Source: Ambulatory Visit | Attending: Anesthesiology | Admitting: Anesthesiology

## 2013-04-29 ENCOUNTER — Encounter (HOSPITAL_COMMUNITY): Payer: Self-pay

## 2013-04-29 ENCOUNTER — Encounter (HOSPITAL_COMMUNITY)
Admission: RE | Admit: 2013-04-29 | Discharge: 2013-04-29 | Disposition: A | Discharge status: Home / Self Care | Payer: Medicare Other | Source: Ambulatory Visit | Attending: Orthopedic Surgery | Admitting: Orthopedic Surgery

## 2013-04-29 DIAGNOSIS — Z01818 Encounter for other preprocedural examination: Secondary | ICD-10-CM | POA: Diagnosis not present

## 2013-04-29 DIAGNOSIS — I10 Essential (primary) hypertension: Secondary | ICD-10-CM | POA: Diagnosis not present

## 2013-04-29 DIAGNOSIS — S42293A Other displaced fracture of upper end of unspecified humerus, initial encounter for closed fracture: Secondary | ICD-10-CM | POA: Diagnosis not present

## 2013-04-29 DIAGNOSIS — M25669 Stiffness of unspecified knee, not elsewhere classified: Secondary | ICD-10-CM | POA: Diagnosis not present

## 2013-04-29 DIAGNOSIS — Z01812 Encounter for preprocedural laboratory examination: Secondary | ICD-10-CM | POA: Diagnosis not present

## 2013-04-29 DIAGNOSIS — M25619 Stiffness of unspecified shoulder, not elsewhere classified: Secondary | ICD-10-CM | POA: Diagnosis not present

## 2013-04-29 DIAGNOSIS — M6281 Muscle weakness (generalized): Secondary | ICD-10-CM | POA: Diagnosis not present

## 2013-04-29 HISTORY — DX: Full incontinence of feces: R15.9

## 2013-04-29 HISTORY — DX: Personal history of pulmonary embolism: Z86.711

## 2013-04-29 HISTORY — DX: Unspecified urinary incontinence: R32

## 2013-04-29 LAB — CBC WITH DIFFERENTIAL/PLATELET
Basophils Absolute: 0 10*3/uL (ref 0.0–0.1)
Basophils Relative: 0 % (ref 0–1)
Eosinophils Absolute: 0.1 10*3/uL (ref 0.0–0.7)
Eosinophils Relative: 3 % (ref 0–5)
HEMATOCRIT: 40.9 % (ref 36.0–46.0)
HEMOGLOBIN: 13.4 g/dL (ref 12.0–15.0)
Lymphocytes Relative: 40 % (ref 12–46)
Lymphs Abs: 1.8 10*3/uL (ref 0.7–4.0)
MCH: 30.8 pg (ref 26.0–34.0)
MCHC: 32.8 g/dL (ref 30.0–36.0)
MCV: 94 fL (ref 78.0–100.0)
MONO ABS: 0.2 10*3/uL (ref 0.1–1.0)
MONOS PCT: 5 % (ref 3–12)
NEUTROS ABS: 2.3 10*3/uL (ref 1.7–7.7)
Neutrophils Relative %: 52 % (ref 43–77)
Platelets: 269 10*3/uL (ref 150–400)
RBC: 4.35 MIL/uL (ref 3.87–5.11)
RDW: 13.3 % (ref 11.5–15.5)
WBC: 4.5 10*3/uL (ref 4.0–10.5)

## 2013-04-29 LAB — COMPREHENSIVE METABOLIC PANEL
ALT: 17 U/L (ref 0–35)
AST: 22 U/L (ref 0–37)
Albumin: 3.9 g/dL (ref 3.5–5.2)
Alkaline Phosphatase: 81 U/L (ref 39–117)
BILIRUBIN TOTAL: 0.6 mg/dL (ref 0.3–1.2)
BUN: 19 mg/dL (ref 6–23)
CHLORIDE: 101 meq/L (ref 96–112)
CO2: 25 meq/L (ref 19–32)
CREATININE: 0.53 mg/dL (ref 0.50–1.10)
Calcium: 9.5 mg/dL (ref 8.4–10.5)
GFR calc non Af Amer: >90 mL/min (ref >90)
Glucose, Bld: 93 mg/dL (ref 70–99)
POTASSIUM: 4.6 meq/L (ref 3.7–5.3)
Sodium: 140 mEq/L (ref 137–147)
Total Protein: 7.5 g/dL (ref 6.0–8.3)

## 2013-04-29 LAB — URINALYSIS, ROUTINE W REFLEX MICROSCOPIC
Bilirubin Urine: NEGATIVE
Glucose, UA: NEGATIVE mg/dL
HGB URINE DIPSTICK: NEGATIVE
Ketones, ur: NEGATIVE mg/dL
Leukocytes, UA: NEGATIVE
NITRITE: NEGATIVE
Protein, ur: NEGATIVE mg/dL
SPECIFIC GRAVITY, URINE: 1.015 (ref 1.005–1.030)
Urobilinogen, UA: 0.2 mg/dL (ref 0.0–1.0)
pH: 7.5 (ref 5.0–8.0)

## 2013-04-29 LAB — PROTIME-INR
INR: 0.99 (ref 0.00–1.49)
PROTHROMBIN TIME: 12.9 s (ref 11.6–15.2)

## 2013-04-29 LAB — TYPE AND SCREEN
ABO/RH(D): A POS
Antibody Screen: NEGATIVE

## 2013-04-29 LAB — SURGICAL PCR SCREEN
MRSA, PCR: NEGATIVE
STAPHYLOCOCCUS AUREUS: NEGATIVE

## 2013-04-29 LAB — APTT: APTT: 30 s (ref 24–37)

## 2013-04-29 NOTE — Progress Notes (Signed)
This Patient tested at an elevated risk for obstructive sleep apnea using the STOP BANG  Tool during a pre-operative  Visit .A score of 4 or greater is considered an elevated risk.

## 2013-04-29 NOTE — Pre-Procedure Instructions (Signed)
Stacy Wilkinson  04/29/2013   Your procedure is scheduled on:  05-09-2013  Monday     [facility]  2 * 3 at 10:15 AM.   Call this number if you have problems the morning of surgery: [phone]   Remember:   Do not eat food or drink liquids after midnight.    Take these medicines the morning of surgery with A SIP OF WATER: methocarbamol,   Do not wear jewelry, make-up or nail polish.  Do not wear lotions, powders, or perfumes.   Do not shave 48 hours prior to surgery.   Do not bring valuables to the hospital.  [facility] belongings or valuables.               Contacts, dentures or bridgework may not be worn into surgery.   Leave suitcase in the car. After surgery it may be brought to your room.  For patients admitted to the hospital, discharge time is determined by your treatment team.               Patients discharged the day of surgery will not be allowed to drive home.    Special Instructions: See instruction sheet for pre-op showers with CHG   Please read over the following fact sheets that you were given: Pain Booklet, Coughing and Deep Breathing, Blood Transfusion Information and Surgical Site Infection Prevention

## 2013-04-30 LAB — URINE CULTURE
COLONY COUNT: NO GROWTH
CULTURE: NO GROWTH

## 2013-05-02 DIAGNOSIS — M25619 Stiffness of unspecified shoulder, not elsewhere classified: Secondary | ICD-10-CM | POA: Diagnosis not present

## 2013-05-02 DIAGNOSIS — M6281 Muscle weakness (generalized): Secondary | ICD-10-CM | POA: Diagnosis not present

## 2013-05-02 DIAGNOSIS — S42293A Other displaced fracture of upper end of unspecified humerus, initial encounter for closed fracture: Secondary | ICD-10-CM | POA: Diagnosis not present

## 2013-05-04 DIAGNOSIS — S42293A Other displaced fracture of upper end of unspecified humerus, initial encounter for closed fracture: Secondary | ICD-10-CM | POA: Diagnosis not present

## 2013-05-04 DIAGNOSIS — M12319 Palindromic rheumatism, unspecified shoulder: Secondary | ICD-10-CM | POA: Diagnosis not present

## 2013-05-04 DIAGNOSIS — M6281 Muscle weakness (generalized): Secondary | ICD-10-CM | POA: Diagnosis not present

## 2013-05-06 NOTE — Progress Notes (Signed)
Patient notified of new arrival time of 05:30 verbalized understanding.

## 2013-05-08 MED ORDER — CHLORHEXIDINE GLUCONATE 4 % EX LIQD
60.0000 mL | Freq: Once | CUTANEOUS | Status: DC
  Filled 2013-05-08: qty 60

## 2013-05-08 MED ORDER — CEFAZOLIN SODIUM-DEXTROSE 2-3 GM-% IV SOLR: 2.0000 g | INTRAVENOUS | Status: DC

## 2013-05-08 MED ORDER — VANCOMYCIN HCL 10 G IV SOLR
1500.0000 mg | INTRAVENOUS | Status: DC
  Filled 2013-05-08: qty 1500

## 2013-05-08 MED ORDER — POVIDONE-IODINE 7.5 % EX SOLN
Freq: Once | CUTANEOUS | Status: DC
  Filled 2013-05-08: qty 118

## 2013-05-08 MED ORDER — LACTATED RINGERS IV SOLN
INTRAVENOUS | Status: DC
  Administered 2013-05-09: 07:00:00 via INTRAVENOUS

## 2013-05-09 ENCOUNTER — Encounter (HOSPITAL_COMMUNITY): Payer: Medicare Other | Admitting: Anesthesiology

## 2013-05-09 ENCOUNTER — Inpatient Hospital Stay (HOSPITAL_COMMUNITY)
Admission: RE | Admit: 2013-05-09 | Discharge: 2013-05-12 | DRG: 470 | Disposition: A | Discharge status: Skilled Nursing Facility | Payer: Medicare Other | Source: Ambulatory Visit | Attending: Orthopedic Surgery | Admitting: Orthopedic Surgery

## 2013-05-09 ENCOUNTER — Encounter (HOSPITAL_COMMUNITY)
Admission: RE | Disposition: A | Discharge status: Skilled Nursing Facility | Payer: Self-pay | Source: Ambulatory Visit | Attending: Orthopedic Surgery

## 2013-05-09 ENCOUNTER — Encounter (HOSPITAL_COMMUNITY): Payer: Self-pay | Admitting: *Deleted

## 2013-05-09 ENCOUNTER — Inpatient Hospital Stay (HOSPITAL_COMMUNITY): Payer: Medicare Other | Admitting: Anesthesiology

## 2013-05-09 DIAGNOSIS — M62838 Other muscle spasm: Secondary | ICD-10-CM | POA: Diagnosis not present

## 2013-05-09 DIAGNOSIS — Z6841 Body Mass Index (BMI) 40.0 and over, adult: Secondary | ICD-10-CM

## 2013-05-09 DIAGNOSIS — R159 Full incontinence of feces: Secondary | ICD-10-CM | POA: Diagnosis not present

## 2013-05-09 DIAGNOSIS — S42309D Unspecified fracture of shaft of humerus, unspecified arm, subsequent encounter for fracture with routine healing: Secondary | ICD-10-CM | POA: Diagnosis not present

## 2013-05-09 DIAGNOSIS — M2540 Effusion, unspecified joint: Secondary | ICD-10-CM | POA: Diagnosis not present

## 2013-05-09 DIAGNOSIS — M1710 Unilateral primary osteoarthritis, unspecified knee: Secondary | ICD-10-CM | POA: Diagnosis not present

## 2013-05-09 DIAGNOSIS — IMO0002 Reserved for concepts with insufficient information to code with codable children: Secondary | ICD-10-CM | POA: Diagnosis not present

## 2013-05-09 DIAGNOSIS — R32 Unspecified urinary incontinence: Secondary | ICD-10-CM | POA: Diagnosis not present

## 2013-05-09 DIAGNOSIS — M6281 Muscle weakness (generalized): Secondary | ICD-10-CM | POA: Diagnosis not present

## 2013-05-09 DIAGNOSIS — R269 Unspecified abnormalities of gait and mobility: Secondary | ICD-10-CM | POA: Diagnosis not present

## 2013-05-09 DIAGNOSIS — S42201A Unspecified fracture of upper end of right humerus, initial encounter for closed fracture: Secondary | ICD-10-CM | POA: Diagnosis present

## 2013-05-09 DIAGNOSIS — Z471 Aftercare following joint replacement surgery: Secondary | ICD-10-CM | POA: Diagnosis not present

## 2013-05-09 DIAGNOSIS — I10 Essential (primary) hypertension: Secondary | ICD-10-CM | POA: Diagnosis not present

## 2013-05-09 DIAGNOSIS — I2699 Other pulmonary embolism without acute cor pulmonale: Secondary | ICD-10-CM | POA: Diagnosis present

## 2013-05-09 DIAGNOSIS — Z86711 Personal history of pulmonary embolism: Secondary | ICD-10-CM | POA: Diagnosis not present

## 2013-05-09 DIAGNOSIS — M1711 Unilateral primary osteoarthritis, right knee: Secondary | ICD-10-CM | POA: Diagnosis present

## 2013-05-09 DIAGNOSIS — Z96659 Presence of unspecified artificial knee joint: Secondary | ICD-10-CM | POA: Diagnosis not present

## 2013-05-09 DIAGNOSIS — M1990 Unspecified osteoarthritis, unspecified site: Secondary | ICD-10-CM | POA: Diagnosis present

## 2013-05-09 DIAGNOSIS — E669 Obesity, unspecified: Secondary | ICD-10-CM | POA: Diagnosis present

## 2013-05-09 DIAGNOSIS — G8918 Other acute postprocedural pain: Secondary | ICD-10-CM | POA: Diagnosis not present

## 2013-05-09 DIAGNOSIS — M179 Osteoarthritis of knee, unspecified: Secondary | ICD-10-CM | POA: Diagnosis present

## 2013-05-09 HISTORY — PX: TOTAL KNEE ARTHROPLASTY: SHX125

## 2013-05-09 SURGERY — ARTHROPLASTY, KNEE, TOTAL
Anesthesia: General | Site: Knee | Laterality: Right

## 2013-05-09 MED ORDER — PHENYLEPHRINE HCL 10 MG/ML IJ SOLN
INTRAMUSCULAR | Status: DC | PRN
  Administered 2013-05-09: 80 ug via INTRAVENOUS
  Administered 2013-05-09: 120 ug via INTRAVENOUS

## 2013-05-09 MED ORDER — CEFUROXIME SODIUM 1.5 G IJ SOLR
INTRAMUSCULAR | Status: AC
  Filled 2013-05-09: qty 1.5

## 2013-05-09 MED ORDER — BISACODYL 5 MG PO TBEC
10.0000 mg | DELAYED_RELEASE_TABLET | Freq: Every day | ORAL | Status: DC
  Administered 2013-05-09 – 2013-05-12 (×4): 10 mg via ORAL
  Filled 2013-05-09 (×5): qty 2

## 2013-05-09 MED ORDER — ONDANSETRON HCL 4 MG/2ML IJ SOLN: 4.0000 mg | Freq: Once | INTRAMUSCULAR | Status: DC | PRN

## 2013-05-09 MED ORDER — PROPOFOL 10 MG/ML IV BOLUS
INTRAVENOUS | Status: DC | PRN
  Administered 2013-05-09: 130 mg via INTRAVENOUS

## 2013-05-09 MED ORDER — ACETAMINOPHEN 650 MG RE SUPP: 650.0000 mg | Freq: Four times a day (QID) | RECTAL | Status: DC | PRN

## 2013-05-09 MED ORDER — METHOCARBAMOL 500 MG PO TABS
ORAL_TABLET | ORAL | Status: AC
  Filled 2013-05-09: qty 1

## 2013-05-09 MED ORDER — SODIUM CHLORIDE 0.9 % IV SOLN
1000.0000 mg | Freq: Two times a day (BID) | INTRAVENOUS | Status: DC
  Administered 2013-05-09: 1500 mg via INTRAVENOUS

## 2013-05-09 MED ORDER — SODIUM CHLORIDE 0.9 % IR SOLN
Status: DC | PRN
  Administered 2013-05-09: 1000 mL

## 2013-05-09 MED ORDER — 0.9 % SODIUM CHLORIDE (POUR BTL) OPTIME
TOPICAL | Status: DC | PRN
  Administered 2013-05-09: 1000 mL

## 2013-05-09 MED ORDER — MIDAZOLAM HCL 5 MG/5ML IJ SOLN
INTRAMUSCULAR | Status: DC | PRN
  Administered 2013-05-09: 2 mg via INTRAVENOUS

## 2013-05-09 MED ORDER — NEOSTIGMINE METHYLSULFATE 1 MG/ML IJ SOLN
INTRAMUSCULAR | Status: AC
  Filled 2013-05-09: qty 10

## 2013-05-09 MED ORDER — ONDANSETRON HCL 4 MG PO TABS: 4.0000 mg | ORAL_TABLET | Freq: Four times a day (QID) | ORAL | Status: DC | PRN

## 2013-05-09 MED ORDER — LIDOCAINE HCL (CARDIAC) 20 MG/ML IV SOLN
INTRAVENOUS | Status: DC | PRN
  Administered 2013-05-09: 30 mg via INTRAVENOUS

## 2013-05-09 MED ORDER — LIDOCAINE HCL (CARDIAC) 20 MG/ML IV SOLN
INTRAVENOUS | Status: AC
  Filled 2013-05-09: qty 5

## 2013-05-09 MED ORDER — GLYCOPYRROLATE 0.2 MG/ML IJ SOLN
INTRAMUSCULAR | Status: DC | PRN
  Administered 2013-05-09: 0.4 mg via INTRAVENOUS

## 2013-05-09 MED ORDER — METOCLOPRAMIDE HCL 10 MG PO TABS: 5.0000 mg | ORAL_TABLET | Freq: Three times a day (TID) | ORAL | Status: DC | PRN

## 2013-05-09 MED ORDER — DIPHENHYDRAMINE HCL 12.5 MG/5ML PO ELIX: 12.5000 mg | ORAL_SOLUTION | ORAL | Status: DC | PRN

## 2013-05-09 MED ORDER — DEXAMETHASONE SODIUM PHOSPHATE 4 MG/ML IJ SOLN
10.0000 mg | Freq: Once | INTRAMUSCULAR | Status: AC
  Administered 2013-05-09: 10 mg via INTRAVENOUS
  Filled 2013-05-09: qty 2.5

## 2013-05-09 MED ORDER — DEXAMETHASONE SODIUM PHOSPHATE 10 MG/ML IJ SOLN
10.0000 mg | Freq: Three times a day (TID) | INTRAMUSCULAR | Status: AC
  Filled 2013-05-09 (×3): qty 1

## 2013-05-09 MED ORDER — ROCURONIUM BROMIDE 50 MG/5ML IV SOLN
INTRAVENOUS | Status: AC
  Filled 2013-05-09: qty 1

## 2013-05-09 MED ORDER — ACETAMINOPHEN 325 MG PO TABS: 650.0000 mg | ORAL_TABLET | Freq: Four times a day (QID) | ORAL | Status: DC | PRN

## 2013-05-09 MED ORDER — DEXAMETHASONE SODIUM PHOSPHATE 4 MG/ML IJ SOLN
INTRAMUSCULAR | Status: AC
  Filled 2013-05-09: qty 3

## 2013-05-09 MED ORDER — ROCURONIUM BROMIDE 100 MG/10ML IV SOLN
INTRAVENOUS | Status: DC | PRN
  Administered 2013-05-09: 50 mg via INTRAVENOUS

## 2013-05-09 MED ORDER — POTASSIUM CHLORIDE IN NACL 20-0.9 MEQ/L-% IV SOLN
INTRAVENOUS | Status: DC
  Administered 2013-05-09 – 2013-05-10 (×2): via INTRAVENOUS
  Filled 2013-05-09 (×11): qty 1000

## 2013-05-09 MED ORDER — VANCOMYCIN HCL IN DEXTROSE 1-5 GM/200ML-% IV SOLN
INTRAVENOUS | Status: AC
  Filled 2013-05-09: qty 200

## 2013-05-09 MED ORDER — RIVAROXABAN 10 MG PO TABS
10.0000 mg | ORAL_TABLET | Freq: Every day | ORAL | Status: DC
  Administered 2013-05-10 – 2013-05-12 (×3): 10 mg via ORAL
  Filled 2013-05-09 (×4): qty 1

## 2013-05-09 MED ORDER — FENTANYL CITRATE 0.05 MG/ML IJ SOLN
INTRAMUSCULAR | Status: DC | PRN
Start: 2013-05-09 — End: 2013-05-09
  Administered 2013-05-09: 50 ug via INTRAVENOUS
  Administered 2013-05-09: 100 ug via INTRAVENOUS
  Administered 2013-05-09: 50 ug via INTRAVENOUS

## 2013-05-09 MED ORDER — CEFAZOLIN SODIUM-DEXTROSE 2-3 GM-% IV SOLR
2.0000 g | INTRAVENOUS | Status: AC
  Administered 2013-05-09: 2 g via INTRAVENOUS
  Filled 2013-05-09: qty 50

## 2013-05-09 MED ORDER — CEFAZOLIN SODIUM-DEXTROSE 2-3 GM-% IV SOLR
2.0000 g | Freq: Four times a day (QID) | INTRAVENOUS | Status: AC
  Administered 2013-05-09 (×2): 2 g via INTRAVENOUS
  Filled 2013-05-09 (×3): qty 50

## 2013-05-09 MED ORDER — ONDANSETRON HCL 4 MG/2ML IJ SOLN
INTRAMUSCULAR | Status: DC | PRN
  Administered 2013-05-09: 4 mg via INTRAVENOUS

## 2013-05-09 MED ORDER — NEOSTIGMINE METHYLSULFATE 1 MG/ML IJ SOLN
INTRAMUSCULAR | Status: DC | PRN
  Administered 2013-05-09: 3 mg via INTRAVENOUS

## 2013-05-09 MED ORDER — FENTANYL CITRATE 0.05 MG/ML IJ SOLN
INTRAMUSCULAR | Status: AC
  Filled 2013-05-09: qty 2

## 2013-05-09 MED ORDER — BUPIVACAINE-EPINEPHRINE 0.25% -1:200000 IJ SOLN
INTRAMUSCULAR | Status: DC | PRN
  Administered 2013-05-09: 30 mL

## 2013-05-09 MED ORDER — HYDROCODONE-ACETAMINOPHEN 10-325 MG PO TABS
1.0000 | ORAL_TABLET | ORAL | Status: DC | PRN
  Administered 2013-05-10: 2 via ORAL
  Administered 2013-05-10 (×2): 1 via ORAL
  Administered 2013-05-11 (×2): 2 via ORAL
  Filled 2013-05-09: qty 2
  Filled 2013-05-09: qty 1
  Filled 2013-05-09 (×2): qty 2
  Filled 2013-05-09: qty 1

## 2013-05-09 MED ORDER — ALUM & MAG HYDROXIDE-SIMETH 200-200-20 MG/5ML PO SUSP: 30.0000 mL | ORAL | Status: DC | PRN

## 2013-05-09 MED ORDER — CELECOXIB 200 MG PO CAPS
200.0000 mg | ORAL_CAPSULE | Freq: Two times a day (BID) | ORAL | Status: DC
Start: 2013-05-09 — End: 2013-05-12
  Administered 2013-05-09 – 2013-05-12 (×7): 200 mg via ORAL
  Filled 2013-05-09 (×8): qty 1

## 2013-05-09 MED ORDER — ONDANSETRON HCL 4 MG/2ML IJ SOLN: 4.0000 mg | Freq: Four times a day (QID) | INTRAMUSCULAR | Status: DC | PRN

## 2013-05-09 MED ORDER — PHENYLEPHRINE 40 MCG/ML (10ML) SYRINGE FOR IV PUSH (FOR BLOOD PRESSURE SUPPORT)
PREFILLED_SYRINGE | INTRAVENOUS | Status: AC
  Filled 2013-05-09: qty 10

## 2013-05-09 MED ORDER — PHENOL 1.4 % MT LIQD: 1.0000 | OROMUCOSAL | Status: DC | PRN

## 2013-05-09 MED ORDER — FENTANYL CITRATE 0.05 MG/ML IJ SOLN
INTRAMUSCULAR | Status: AC
  Filled 2013-05-09: qty 5

## 2013-05-09 MED ORDER — MENTHOL 3 MG MT LOZG: 1.0000 | LOZENGE | OROMUCOSAL | Status: DC | PRN

## 2013-05-09 MED ORDER — CEFUROXIME SODIUM 1.5 G IJ SOLR
INTRAMUSCULAR | Status: DC | PRN
  Administered 2013-05-09: 1.5 g

## 2013-05-09 MED ORDER — FENTANYL CITRATE 0.05 MG/ML IJ SOLN
25.0000 ug | INTRAMUSCULAR | Status: DC | PRN
  Administered 2013-05-09: 50 ug via INTRAVENOUS
  Administered 2013-05-09 (×4): 25 ug via INTRAVENOUS

## 2013-05-09 MED ORDER — HYDROMORPHONE HCL PF 1 MG/ML IJ SOLN
1.0000 mg | INTRAMUSCULAR | Status: DC | PRN
  Administered 2013-05-09: 1 mg via INTRAVENOUS
  Filled 2013-05-09 (×2): qty 1

## 2013-05-09 MED ORDER — DOCUSATE SODIUM 100 MG PO CAPS
100.0000 mg | ORAL_CAPSULE | Freq: Two times a day (BID) | ORAL | Status: DC
  Administered 2013-05-09 – 2013-05-12 (×6): 100 mg via ORAL
  Filled 2013-05-09 (×7): qty 1

## 2013-05-09 MED ORDER — FENTANYL CITRATE 0.05 MG/ML IJ SOLN
INTRAMUSCULAR | Status: AC
  Administered 2013-05-09: 25 ug via INTRAVENOUS
  Filled 2013-05-09: qty 2

## 2013-05-09 MED ORDER — MIDAZOLAM HCL 2 MG/2ML IJ SOLN
INTRAMUSCULAR | Status: AC
  Filled 2013-05-09: qty 2

## 2013-05-09 MED ORDER — SUCCINYLCHOLINE CHLORIDE 20 MG/ML IJ SOLN
INTRAMUSCULAR | Status: AC
  Filled 2013-05-09: qty 1

## 2013-05-09 MED ORDER — ONDANSETRON HCL 4 MG/2ML IJ SOLN
INTRAMUSCULAR | Status: AC
  Filled 2013-05-09: qty 2

## 2013-05-09 MED ORDER — DEXAMETHASONE 6 MG PO TABS
10.0000 mg | ORAL_TABLET | Freq: Three times a day (TID) | ORAL | Status: AC
  Administered 2013-05-09 – 2013-05-10 (×3): 10 mg via ORAL
  Filled 2013-05-09 (×3): qty 1

## 2013-05-09 MED ORDER — BUPIVACAINE-EPINEPHRINE (PF) 0.25% -1:200000 IJ SOLN
INTRAMUSCULAR | Status: AC
  Filled 2013-05-09: qty 30

## 2013-05-09 MED ORDER — PROPOFOL 10 MG/ML IV BOLUS
INTRAVENOUS | Status: AC
  Filled 2013-05-09: qty 20

## 2013-05-09 MED ORDER — PROMETHAZINE HCL 25 MG PO TABS: 25.0000 mg | ORAL_TABLET | Freq: Four times a day (QID) | ORAL | Status: DC | PRN

## 2013-05-09 MED ORDER — VANCOMYCIN HCL 500 MG IV SOLR
500.0000 mg | Freq: Once | INTRAVENOUS | Status: DC
  Filled 2013-05-09: qty 500

## 2013-05-09 MED ORDER — METOCLOPRAMIDE HCL 5 MG/ML IJ SOLN: 5.0000 mg | Freq: Three times a day (TID) | INTRAMUSCULAR | Status: DC | PRN

## 2013-05-09 MED ORDER — LISINOPRIL 20 MG PO TABS
20.0000 mg | ORAL_TABLET | Freq: Every day | ORAL | Status: DC
  Administered 2013-05-09 – 2013-05-12 (×4): 20 mg via ORAL
  Filled 2013-05-09 (×4): qty 1

## 2013-05-09 MED ORDER — METHOCARBAMOL 500 MG PO TABS
500.0000 mg | ORAL_TABLET | Freq: Four times a day (QID) | ORAL | Status: DC
  Administered 2013-05-09 – 2013-05-12 (×10): 500 mg via ORAL
  Filled 2013-05-09 (×15): qty 1

## 2013-05-09 MED ORDER — GLYCOPYRROLATE 0.2 MG/ML IJ SOLN
INTRAMUSCULAR | Status: AC
  Filled 2013-05-09: qty 2

## 2013-05-09 SURGICAL SUPPLY — 67 items
BANDAGE ELASTIC 6 VELCRO ST LF (GAUZE/BANDAGES/DRESSINGS) ×3 IMPLANT
BANDAGE ESMARK 6X9 LF (GAUZE/BANDAGES/DRESSINGS) ×1 IMPLANT
BLADE SAGITTAL 25.0X1.19X90 (BLADE) ×2 IMPLANT
BLADE SAGITTAL 25.0X1.19X90MM (BLADE) ×1
BLADE SAW SGTL 11.0X1.19X90.0M (BLADE) IMPLANT
BLADE SAW SGTL 13.0X1.19X90.0M (BLADE) ×3 IMPLANT
BLADE SURG 10 STRL SS (BLADE) ×9 IMPLANT
BNDG ELASTIC 6X15 VLCR STRL LF (GAUZE/BANDAGES/DRESSINGS) ×3 IMPLANT
BNDG ESMARK 6X9 LF (GAUZE/BANDAGES/DRESSINGS) ×3
BOWL SMART MIX CTS (DISPOSABLE) ×3 IMPLANT
CAPT RP KNEE ×3 IMPLANT
CEMENT HV SMART SET (Cement) ×3 IMPLANT
CLOSURE WOUND 1/2 X4 (GAUZE/BANDAGES/DRESSINGS) ×1
COVER SURGICAL LIGHT HANDLE (MISCELLANEOUS) ×3 IMPLANT
CUFF TOURNIQUET SINGLE 34IN LL (TOURNIQUET CUFF) ×3 IMPLANT
CUFF TOURNIQUET SINGLE 44IN (TOURNIQUET CUFF) IMPLANT
DRAPE EXTREMITY T 121X128X90 (DRAPE) ×3 IMPLANT
DRAPE INCISE IOBAN 66X45 STRL (DRAPES) ×3 IMPLANT
DRAPE PROXIMA HALF (DRAPES) ×3 IMPLANT
DRAPE U-SHAPE 47X51 STRL (DRAPES) ×3 IMPLANT
DRSG ADAPTIC 3X8 NADH LF (GAUZE/BANDAGES/DRESSINGS) ×3 IMPLANT
DRSG PAD ABDOMINAL 8X10 ST (GAUZE/BANDAGES/DRESSINGS) ×3 IMPLANT
DURAPREP 26ML APPLICATOR (WOUND CARE) ×6 IMPLANT
ELECT CAUTERY BLADE 6.4 (BLADE) ×3 IMPLANT
ELECT REM PT RETURN 9FT ADLT (ELECTROSURGICAL) ×3
ELECTRODE REM PT RTRN 9FT ADLT (ELECTROSURGICAL) ×1 IMPLANT
EVACUATOR 1/8 PVC DRAIN (DRAIN) ×3 IMPLANT
FACESHIELD WRAPAROUND (MASK) ×3 IMPLANT
GLOVE BIO SURGEON STRL SZ7 (GLOVE) ×3 IMPLANT
GLOVE BIOGEL PI IND STRL 7.0 (GLOVE) ×1 IMPLANT
GLOVE BIOGEL PI IND STRL 7.5 (GLOVE) ×1 IMPLANT
GLOVE BIOGEL PI INDICATOR 7.0 (GLOVE) ×2
GLOVE BIOGEL PI INDICATOR 7.5 (GLOVE) ×2
GLOVE SS BIOGEL STRL SZ 7.5 (GLOVE) ×1 IMPLANT
GLOVE SUPERSENSE BIOGEL SZ 7.5 (GLOVE) ×2
GOWN STRL REUS W/ TWL LRG LVL3 (GOWN DISPOSABLE) ×2 IMPLANT
GOWN STRL REUS W/ TWL XL LVL3 (GOWN DISPOSABLE) ×2 IMPLANT
GOWN STRL REUS W/TWL LRG LVL3 (GOWN DISPOSABLE) ×4
GOWN STRL REUS W/TWL XL LVL3 (GOWN DISPOSABLE) ×4
HANDPIECE INTERPULSE COAX TIP (DISPOSABLE) ×2
HOOD PEEL AWAY FACE SHEILD DIS (HOOD) ×6 IMPLANT
IMMOBILIZER KNEE 22 UNIV (SOFTGOODS) IMPLANT
KIT BASIN OR (CUSTOM PROCEDURE TRAY) ×3 IMPLANT
KIT ROOM TURNOVER OR (KITS) ×3 IMPLANT
MANIFOLD NEPTUNE II (INSTRUMENTS) ×3 IMPLANT
NS IRRIG 1000ML POUR BTL (IV SOLUTION) ×3 IMPLANT
PACK TOTAL JOINT (CUSTOM PROCEDURE TRAY) ×3 IMPLANT
PAD ARMBOARD 7.5X6 YLW CONV (MISCELLANEOUS) ×6 IMPLANT
PAD CAST 4YDX4 CTTN HI CHSV (CAST SUPPLIES) ×1 IMPLANT
PADDING CAST COTTON 4X4 STRL (CAST SUPPLIES) ×2
PADDING CAST COTTON 6X4 STRL (CAST SUPPLIES) ×3 IMPLANT
RUBBERBAND STERILE (MISCELLANEOUS) ×3 IMPLANT
SET HNDPC FAN SPRY TIP SCT (DISPOSABLE) ×1 IMPLANT
SPONGE GAUZE 4X4 12PLY (GAUZE/BANDAGES/DRESSINGS) ×3 IMPLANT
STRIP CLOSURE SKIN 1/2X4 (GAUZE/BANDAGES/DRESSINGS) ×2 IMPLANT
SUCTION FRAZIER TIP 10 FR DISP (SUCTIONS) ×3 IMPLANT
SUT ETHIBOND NAB CT1 #1 30IN (SUTURE) ×6 IMPLANT
SUT MNCRL AB 3-0 PS2 18 (SUTURE) ×3 IMPLANT
SUT VIC AB 0 CT1 27 (SUTURE) ×6
SUT VIC AB 0 CT1 27XBRD ANBCTR (SUTURE) ×3 IMPLANT
SUT VIC AB 2-0 CT1 27 (SUTURE) ×4
SUT VIC AB 2-0 CT1 TAPERPNT 27 (SUTURE) ×2 IMPLANT
SYR 30ML SLIP (SYRINGE) ×3 IMPLANT
TOWEL OR 17X24 6PK STRL BLUE (TOWEL DISPOSABLE) ×3 IMPLANT
TOWEL OR 17X26 10 PK STRL BLUE (TOWEL DISPOSABLE) ×3 IMPLANT
TRAY FOLEY CATH 16FR SILVER (SET/KITS/TRAYS/PACK) ×3 IMPLANT
WATER STERILE IRR 1000ML POUR (IV SOLUTION) ×6 IMPLANT

## 2013-05-09 NOTE — Progress Notes (Signed)
Utilization review completed.  

## 2013-05-09 NOTE — Anesthesia Procedure Notes (Addendum)
Procedure Name: Intubation Date/Time: 05/09/2013 7:25 AM Performed by: Sharlene DoryWALKER, LUZ E Pre-anesthesia Checklist: Patient identified, Emergency Drugs available, Suction available, Patient being monitored and Timeout performed Patient Re-evaluated:Patient Re-evaluated prior to inductionOxygen Delivery Method: Circle system utilized Preoxygenation: Pre-oxygenation with 100% oxygen Intubation Type: IV induction Ventilation: Mask ventilation without difficulty and Oral airway inserted - appropriate to patient size Laryngoscope Size: Mac and 3 Grade View: Grade I Tube type: Oral Tube size: 7.0 mm Number of attempts: 1 Airway Equipment and Method: Stylet Placement Confirmation: ETT inserted through vocal cords under direct vision,  positive ETCO2 and breath sounds checked- equal and bilateral Secured at: 21 cm Tube secured with: Tape Dental Injury: Teeth and Oropharynx as per pre-operative assessment    Anesthesia Regional Block:  Adductor canal block  Pre-Anesthetic Checklist: ,, timeout performed, Correct Patient, Correct Site, Correct Laterality, Correct Procedure, Correct Position, site marked, Risks and benefits discussed,  Surgical consent,  Pre-op evaluation,  At surgeon's request and post-op pain management  Laterality: Right  Prep: chloraprep       Needles:  Injection technique: Single-shot  Needle Type: Echogenic Stimulator Needle     Needle Length: 9cm 9 cm Needle Gauge: 22 and 22 G    Additional Needles:  Procedures: ultrasound guided (picture in chart) Adductor canal block Narrative:  Start time: 05/09/2013 7:05 AM End time: 05/09/2013 7:10 AM Injection made incrementally with aspirations every 5 mL.  Performed by: Personally   Additional Notes: 30 cc 0.5% marcaine with 1:200 Epi injected

## 2013-05-09 NOTE — Anesthesia Postprocedure Evaluation (Signed)
  Anesthesia Post-op Note  Patient: Stacy Wilkinson  Procedure(s) Performed: Procedure(s): TOTAL KNEE ARTHROPLASTY (Right)  Patient Location: PACU  Anesthesia Type:General and GA combined with regional for post-op pain  Level of Consciousness: awake, alert  and oriented  Airway and Oxygen Therapy: Patient Spontanous Breathing and Patient connected to nasal cannula oxygen  Post-op Pain: mild  Post-op Assessment: Post-op Vital signs reviewed, Patient's Cardiovascular Status Stable, Respiratory Function Stable, Patent Airway, No signs of Nausea or vomiting and Pain level controlled  Post-op Vital Signs: stable  Last Vitals:  Filed Vitals:   05/09/13 1205  BP: 139/73  Pulse: 77  Temp: 36.3 C  Resp: 14    Complications: No apparent anesthesia complications

## 2013-05-09 NOTE — Evaluation (Signed)
Physical Therapy Evaluation Patient Details Name: Stacy Wilkinson MRN: [id number] DOB: [birth year] Today's Date: 05/09/2013   History of Present Illness  Pt admitted for RTKA with several recent falls due to knee buckling with resultant R humerus fx 02/18/13 and ORIF  pt reports 2nd RUE surgery 1 month ago to remove pins by Dr.Landau  Clinical Impression  Pt very pleasant but initially very fearful of falling given POD#0. Pt reports she initially requested to return home with HHPT but realizes she will not have assist at home and prefers ST-SNF to become mod I before return home. Given pt recent increase in falls, limited R shoulder ROM and strength and current R TKA ST-SNF is an appropriate plan. Pt with decreased strength, ROM, mobility and gait and will benefit from acute therapy to maximize strength and function to decrease burden of care. 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    Follow Up Recommendations SNF;Supervision for mobility/OOB    Equipment Recommendations  None recommended by PT    Recommendations for Other Services       Precautions / Restrictions Precautions Precautions: Knee;Fall Precaution Comments: limited R shoulder ROM from fx- per pt report no weight bearing restrictions Required Braces or Orthoses: Knee Immobilizer - Right Knee Immobilizer - Right: On when out of bed or walking Restrictions Weight Bearing Restrictions: Yes RLE Weight Bearing: Weight bearing as tolerated      Mobility  Bed Mobility Overal bed mobility: Needs Assistance Bed Mobility: Supine to Sit     Supine to sit: Min assist     General bed mobility comments: cues for sequence to bridge and scoot to EOB then pivot fully to EOB with assist to move and control RLE as well as to fully elevate trunk, increased time  Transfers Overall transfer level: Needs assistance   Transfers: Sit to/from Stand Sit to Stand: Min assist         General transfer  comment: cues for hand placement with pt pushing up through LUE and LLE with very little assist available through RUE  Ambulation/Gait Ambulation/Gait assistance: Min guard Ambulation Distance (Feet): 5 Feet Assistive device: Rolling walker (2 wheeled) Gait Pattern/deviations: Step-to pattern;Antalgic;Decreased stance time - right   Gait velocity interpretation: <1.8 ft/sec, indicative of risk for recurrent falls General Gait Details: cues for sequence, posture and position in Rw  Stairs            Wheelchair Mobility    Modified Rankin (Stroke Patients Only)       Balance                                             Pertinent Vitals/Pain Pain 6/10 in standing, 4/10 at rest end of session HR 85 sats 99% on RA    Home Living Family/patient expects to be discharged to:: Private residence Living Arrangements: Alone Available Help at Discharge: Family;Available PRN/intermittently Type of Home: House Home Access: Stairs to enter   Entrance Stairs-Number of Steps: 3 Home Layout: One level Home Equipment: Walker - 2 wheels;Bedside commode;Cane - single point      Prior Function Level of Independence: Independent               Hand Dominance        Extremity/Trunk Assessment   Upper Extremity Assessment: Defer to OT evaluation  Lower Extremity Assessment: RLE deficits/detail RLE Deficits / Details: post op pain with expected limited strength and ROM    Cervical / Trunk Assessment: Normal  Communication   Communication: No difficulties  Cognition Arousal/Alertness: Awake/alert Behavior During Therapy: WFL for tasks assessed/performed Overall Cognitive Status: Within Functional Limits for tasks assessed                      General Comments      Exercises Total Joint Exercises Ankle Circles/Pumps: AROM;Both;10 reps;Supine Quad Sets: AROM;Supine;Right;5 reps Heel Slides: AAROM;Right;5 reps;Supine       Assessment/Plan    PT Assessment Patient needs continued PT services  PT Diagnosis Difficulty walking;Acute pain   PT Problem List Decreased strength;Decreased range of motion;Decreased activity tolerance;Decreased mobility;Decreased knowledge of use of DME;Pain  PT Treatment Interventions DME instruction;Gait training;Functional mobility training;Therapeutic activities;Therapeutic exercise;Patient/family education   PT Goals (Current goals can be found in the Care Plan section) Acute Rehab PT Goals Patient Stated Goal: be able to walk without pain PT Goal Formulation: With patient Time For Goal Achievement: 05/23/13 Potential to Achieve Goals: Good    Frequency 7X/week   Barriers to discharge Decreased caregiver support      Co-evaluation               End of Session Equipment Utilized During Treatment: Gait belt Activity Tolerance: Patient tolerated treatment well Patient left: in chair;with call bell/phone within reach;with family/visitor present Nurse Communication: Mobility status         Time: [id number] PT Time Calculation (min): 32 min   Charges:   PT Evaluation $Initial PT Evaluation Tier I: 1 Procedure PT Treatments $Gait Training: 8-22 mins $Therapeutic Activity: 8-22 mins   PT G Codes:          Chiante Peden B Ericha Whittingham 05/09/2013, 2:10 PM  Delaney MeigsMaija Tabor Rosemond Lyttle, PT [phone]

## 2013-05-09 NOTE — Anesthesia Preprocedure Evaluation (Addendum)
Anesthesia Evaluation  Patient identified by MRN, date of birth, ID band Patient awake    Reviewed: Allergy & Precautions, H&P , NPO status , Patient's Chart, lab work & pertinent test results  Airway Mallampati: II TM Distance: >3 FB Neck ROM: Full    Dental  (+) Teeth Intact, Dental Advisory Given   Pulmonary  breath sounds clear to auscultation        Cardiovascular hypertension, Rhythm:Regular Rate:Normal     Neuro/Psych    GI/Hepatic   Endo/Other    Renal/GU      Musculoskeletal   Abdominal (+) + obese,   Peds  Hematology   Anesthesia Other Findings   Reproductive/Obstetrics                          Anesthesia Physical Anesthesia Plan  ASA: III  Anesthesia Plan: General   Post-op Pain Management:    Induction: Intravenous  Airway Management Planned: Oral ETT  Additional Equipment:   Intra-op Plan:   Post-operative Plan:   Informed Consent: I have reviewed the patients History and Physical, chart, labs and discussed the procedure including the risks, benefits and alternatives for the proposed anesthesia with the patient or authorized representative who has indicated his/her understanding and acceptance.   Dental advisory given  Plan Discussed with: CRNA and Anesthesiologist  Anesthesia Plan Comments: (DJD R. Knee Obesity Hypertension H/O pulmonary embolus R. Knee  Plan GA with FNB  Kipp Broodavid Joslin, MD)        Anesthesia Quick Evaluation

## 2013-05-09 NOTE — Transfer of Care (Signed)
Immediate Anesthesia Transfer of Care Note  Patient: Stacy Wilkinson  Procedure(s) Performed: Procedure(s): TOTAL KNEE ARTHROPLASTY (Right)  Patient Location: PACU  Anesthesia Type:General  Level of Consciousness: sedated  Airway & Oxygen Therapy: Patient Spontanous Breathing and Patient connected to nasal cannula oxygen  Post-op Assessment: Report given to PACU RN and Post -op Vital signs reviewed and stable  Post vital signs: Reviewed and stable  Complications: No apparent anesthesia complications

## 2013-05-09 NOTE — Interval H&P Note (Signed)
History and Physical Interval Note:  05/09/2013 7:07 AM  Stacy Wilkinson  has presented today for surgery, with the diagnosis of RIGHT KNEE DJD  The various methods of treatment have been discussed with the patient and family. After consideration of risks, benefits and other options for treatment, the patient has consented to  Procedure(s): TOTAL KNEE ARTHROPLASTY (Right) as a surgical intervention .  The patient's history has been reviewed, patient examined, no change in status, stable for surgery.  I have reviewed the patient's chart and labs.  Questions were answered to the patient's satisfaction.     Nilda Simmerobert A Journie Howson

## 2013-05-09 NOTE — Op Note (Signed)
MRN:     [id number] DOB/AGE:    October 10, 1945 / 68 y.o.       OPERATIVE REPORT    DATE OF PROCEDURE:  05/09/2013       PREOPERATIVE DIAGNOSIS:   RIGHT KNEE DJD      Estimated body mass index is 43.27 kg/(m^2) as calculated from the following:   Height as of this encounter: 5\' 5"  (1.651 m).   Weight as of this encounter: 117.935 kg (260 lb).                                                        POSTOPERATIVE DIAGNOSIS:   right knee djd                                                                      PROCEDURE:  Procedure(s): TOTAL KNEE ARTHROPLASTY Using Depuy Sigma RP implants #3 Femur, #4Tibia, 12.485mm sigma RP bearing, 32 Patella Right knee lateral release     SURGEON: Nilda Simmerobert A Bralynn Velador    ASSISTANT:  Kirstin Shepperson PA-C   (Present and scrubbed throughout the case, critical for assistance with exposure, retraction, instrumentation, and closure.)         ANESTHESIA: GET with Femoral Nerve Block  DRAINS: foley, 2 medium hemovac in knee   TOURNIQUET TIME: 75min   COMPLICATIONS:  None     SPECIMENS: None   INDICATIONS FOR PROCEDURE: The patient has  RIGHT KNEE DJD, varus deformities, XR shows bone on bone arthritis. Patient has failed all conservative measures including anti-inflammatory medicines, narcotics, attempts at  exercise and weight loss, cortisone injections and viscosupplementation.  Risks and benefits of surgery have been discussed, questions answered.   DESCRIPTION OF PROCEDURE: The patient identified by armband, received  right femoral nerve block and IV antibiotics, [facility]. Patient taken to the operating room, appropriate anesthetic  monitors were attached General endotracheal anesthesia induced with  the patient in supine position, Foley catheter was inserted. Tourniquet  applied high to the operative thigh. Lateral post and foot positioner  applied to the table, the lower extremity was then prepped and draped  in usual sterile  fashion from the ankle to the tourniquet. Time-out procedure was performed. The limb was wrapped with an Esmarch bandage and the tourniquet inflated to 365 mmHg. We began the operation by making the anterior midline incision starting at handbreadth above the patella going over the patella 1 cm medial to and  4 cm distal to the tibial tubercle. Small bleeders in the skin and the  subcutaneous tissue identified and cauterized. Transverse retinaculum was incised and reflected medially and a medial parapatellar arthrotomy was accomplished. the patella was everted and theprepatellar fat pad resected. The superficial medial collateral  ligament was then elevated from anterior to posterior along the proximal  flare of the tibia and anterior half of the menisci resected. The knee was hyperflexed exposing bone on bone arthritis. Peripheral and notch osteophytes as well as the cruciate ligaments were then resected. We continued to  work our way around posteriorly along  the proximal tibia, and externally  rotated the tibia subluxing it out from underneath the femur. A McHale  retractor was placed through the notch and a lateral Hohmann retractor  placed, and we then drilled through the proximal tibia in line with the  axis of the tibia followed by an intramedullary guide rod and 2-degree  posterior slope cutting guide. The tibial cutting guide was pinned into place  allowing resection of 4 mm of bone medially and about 6 mm of bone  laterally because of her varus deformity. Satisfied with the tibial resection, we then  entered the distal femur 2 mm anterior to the PCL origin with the  intramedullary guide rod and applied the distal femoral cutting guide  set at 11mm, with 5 degrees of valgus. This was pinned along the  epicondylar axis. At this point, the distal femoral cut was accomplished without difficulty. We then sized for a #3 femoral component and pinned the guide in 3 degrees of external rotation.The  chamfer cutting guide was pinned into place. The anterior, posterior, and chamfer cuts were accomplished without difficulty followed by  the Sigma RP box cutting guide and the box cut. We also removed posterior osteophytes from the posterior femoral condyles. At this  time, the knee was brought into full extension. We checked our  extension and flexion gaps and found them symmetric at 12.945mm.  The patella thickness measured at 24 mm. We set the cutting guide at 15 and removed the posterior 9.5-10 mm  of the patella sized for 32 button and drilled the lollipop. A lateral release needed to be performed for normal patella tracking.  The knee  was then once again hyperflexed exposing the proximal tibia. We sized for a #4 tibial base plate, applied the smokestack and the conical reamer followed by the the Delta fin keel punch. We then hammered into place the Sigma RP trial femoral component, inserted a 12.5-mm trial bearing, trial patellar button, and took the knee through range of motion from 0-130 degrees. No thumb pressure was required for patellar  Tracking after the lateral release.  At this point, all trial components were removed, a double batch of DePuy HV cement with 1500 mg of Zinacef was mixed and applied to all bony metallic mating surfaces except for the posterior condyles of the femur itself. In order, we  hammered into place the tibial tray and removed excess cement, the femoral component and removed excess cement, a 12.5-mm Sigma RP bearing  was inserted, and the knee brought to full extension with compression.  The patellar button was clamped into place, and excess cement  removed. While the cement cured the wound was irrigated out with normal saline solution pulse lavage, and medium Hemovac drains were placed.. Ligament stability and patellar tracking were checked and found to be excellent. The tourniquet was then released and hemostasis was obtained with cautery. The parapatellar arthrotomy  was closed with  #1 ethibond suture. The subcutaneous tissue with 0 and 2-0 undyed  Vicryl suture, and 4-0 Monocryl.. A dressing of Xeroform,  4 x 4, dressing sponges, Webril, and Ace wrap applied. Needle and sponge count were correct times 2.The patient awakened, extubated, and taken to recovery room without difficulty. Vascular status was normal, pulses 2+ and symmetric.   Nilda Simmerobert A Omar Gayden 05/09/2013, 9:03 AM

## 2013-05-09 NOTE — Plan of Care (Signed)
Problem: Consults Goal: Diagnosis- Total Joint Replacement Outcome: Completed/Met Date Met:  05/09/13 Primary Total Knee Right

## 2013-05-09 NOTE — Progress Notes (Signed)
Orthopedic Tech Progress Note Patient Details:  Stacy Wilkinson 1945-04-06 [id number] CPM applied to RLE with appropriate settings. OHF applied to bed. Footsie roll provided. CPM Right Knee CPM Right Knee: On Right Knee Flexion (Degrees): 60 Right Knee Extension (Degrees): 0   Asia R Thompson 05/09/2013, 11:19 AM

## 2013-05-09 NOTE — Progress Notes (Signed)
ANTICOAGULATION CONSULT NOTE - Initial Consult  Pharmacy Consult for xarelto Indication: VTE prophylaxis  Allergies  Allergen Reactions  . Percocet [Oxycodone-Acetaminophen] Itching    Rash     Patient Measurements: Height: 5\' 5"  (165.1 cm) Weight: 260 lb (117.935 kg) IBW/kg (Calculated) : 57  Vital Signs: Temp: 97.3 F (36.3 C) (04/20 1205) Temp src: Oral (04/20 0607) BP: 139/73 mmHg (04/20 1205) Pulse Rate: 85 (04/20 1357)  Labs: No results found for this basename: HGB, HCT, PLT, APTT, LABPROT, INR, HEPARINUNFRC, CREATININE, CKTOTAL, CKMB, TROPONINI,  in the last 72 hours  Estimated Creatinine Clearance: 90.1 ml/min (by C-G formula based on Cr of 0.53).   Medical History: Past Medical History  Diagnosis Date  . Arthritis   . Hypertension   . Wears glasses   . Fracture of humerus, proximal, right, closed 02/18/2013  . Right knee DJD   . History of pulmonary embolus (PE)     after sx for fx humerous  . Incontinence of bowel   . Incontinence of urine     Assessment: 89 YOF s/p R TKA , pharmacy is consulted to dose xarelto for VTE prophylaxis. Most recent labs on 4/10, hgb 13.4, plt 269, scr 0.53, est. crcl ~ 90 ml/min.  Goal of Therapy:  Monitor platelets by anticoagulation protocol: Yes   Plan:  - Xarelto 10mg  daily with meal as ordered - [facility] - Pharmacy sign off.  Bayard HuggerMei Remy Dia, PharmD, BCPS  Clinical Pharmacist  Pager: [phone]   05/09/2013,2:04 PM

## 2013-05-09 NOTE — Discharge Instructions (Signed)
Information on my medicine - XARELTO® (Rivaroxaban) ° °This medication education was reviewed with me or my healthcare representative as part of my discharge preparation.   °Why was Xarelto® prescribed for you? °Xarelto® was prescribed for you to reduce the risk of blood clots forming after orthopedic surgery. The medical term for these abnormal blood clots is venous thromboembolism (VTE). ° °What do you need to know about xarelto® ? °Take your Xarelto® 10 mg ONCE DAILY at the same time every day. °You may take it either with or without food. ° °If you have difficulty swallowing the tablet whole, you may crush it and mix in applesauce just prior to taking your dose. ° °Take Xarelto® exactly as prescribed by your doctor and DO NOT stop taking Xarelto® without talking to the doctor who prescribed the medication.  Stopping without other VTE prevention medication to take the place of Xarelto® may increase your risk of developing a clot. ° °After discharge, you should have regular check-up appointments with your healthcare provider that is prescribing your Xarelto®.   ° °What do you do if you miss a dose? °If you miss a dose, take it as soon as you remember on the same day then continue your regularly scheduled once daily regimen the next day. Do not take two doses of Xarelto® on the same day.  ° °Important Safety Information °A possible side effect of Xarelto® is bleeding. You should call your healthcare provider right away if you experience any of the following: °  Bleeding from an injury or your nose that does not stop. °  Unusual colored urine (red or dark brown) or unusual colored stools (red or black). °  Unusual bruising for unknown reasons. °  A serious fall or if you hit your head (even if there is no bleeding). ° °Some medicines may interact with Xarelto® and might increase your risk of bleeding while on Xarelto®. To help avoid this, consult your healthcare provider or pharmacist prior to using any new  prescription or non-prescription medications, including herbals, vitamins, non-steroidal anti-inflammatory drugs (NSAIDs) and supplements. ° °This website has more information on Xarelto®: www.xarelto.com. ° ° ° °

## 2013-05-09 NOTE — H&P (View-Only) (Signed)
TOTAL KNEE ADMISSION H&P  Patient is being admitted for right total knee arthroplasty.  Subjective:  Chief Complaint:right knee pain.  HPI: Stacy Wilkinson, 68 y.o. female, has a history of pain and functional disability in the right knee due to arthritis and has failed non-surgical conservative treatments for greater Journey 12 weeks to includeNSAID's and/or analgesics, corticosteriod injections, viscosupplementation injections, flexibility and strengthening excercises, supervised PT with diminished ADL's post treatment, use of assistive devices and activity modification.  Onset of symptoms was gradual, starting 10 years ago with gradually worsening course since that time. The patient noted no past surgery on the right knee(s).  Patient currently rates pain in the right knee(s) at 10 out of 10 with activity. Patient has night pain, worsening of pain with activity and weight bearing, pain that interferes with activities of daily living, pain with passive range of motion, crepitus and joint swelling.  Patient has evidence of subchondral sclerosis, periarticular osteophytes and joint space narrowing by imaging studies.  There is no active infection.  Patient Active Problem List   Diagnosis Date Noted  . Fracture of humerus, proximal, right, closed 02/18/2013  . Fracture of right humerus 02/18/2013  . PULMONARY EMBOLISM 09/01/2008  . PULMONARY EMBOLISM 08/31/2008  . OSTEOARTHRITIS 08/31/2008   Past Medical History  Diagnosis Date  . Arthritis   . Hypertension   . Wears glasses   . Fracture of humerus, proximal, right, closed 02/18/2013  . Right knee DJD     Past Surgical History  Procedure Laterality Date  . Joint replacement  2010    Left TKR.  . Orif humerus fracture Right 02/18/2013    Procedure: OPEN TREATMENT RIGHT  HUMERAL SHAFT FRACTURE WITH HUMERAL ROD AND LOCKING SCREWS;  Surgeon: Eulas PostJoshua P Landau, MD;  Location: [facility];  Service: Orthopedics;  Laterality: Right;      (Not in a hospital admission) Allergies  Allergen Reactions  . Percocet [Oxycodone-Acetaminophen] Itching    Rash     History  Substance Use Topics  . Smoking status: Never Smoker   . Smokeless tobacco: Not on file  . Alcohol Use: No    No family history on file.   Review of Systems  Constitutional: Negative.   HENT: Negative.   Eyes: Negative.   Respiratory: Negative.   Cardiovascular: Negative.   Gastrointestinal: Negative.   Genitourinary: Negative.   Musculoskeletal: Positive for back pain and joint pain.  Skin: Negative.   Neurological: Negative.   Endo/Heme/Allergies: Negative.   Psychiatric/Behavioral: Negative.     Objective:  Physical Exam  Constitutional: She is oriented to person, place, and time. She appears well-developed and well-nourished.  HENT:  Head: Normocephalic and atraumatic.  Eyes: Conjunctivae and EOM are normal. Pupils are equal, round, and reactive to light.  Neck: Neck supple.  Cardiovascular: Normal rate, regular rhythm and normal heart sounds.   Respiratory: Effort normal and breath sounds normal.  GI: Soft. Bowel sounds are normal.  Genitourinary:  Not pertinent to current symptomatology therefore not examined.  Musculoskeletal:  Examination of her right knee reveals pain posterolaterally with 1+ effusion.  Range of motion -5 to 120 degrees.  Knee is stable with normal patella tracking and diffuse pain.  Examination of the left knee reveals a well healed total knee incision without swelling or pain.  Full range of motion.  Knee is stable with normal patella tracking.  Vascular exam: Pulses are 2+ and symmetric.  Right leg crush injury in 2005.  Took 1 and 1/2  years to heal.  Moderate venous stasis  Neurological: She is alert and oriented to person, place, and time.  Skin: Skin is warm and dry.  Psychiatric: She has a normal mood and affect. Her behavior is normal.    Vital signs in last 24 hours: @VSRANGES @  Labs:   Estimated  body mass index is 44.61 kg/(m^2) as calculated from the following:   Height as of this encounter: 5\' 4"  (1.626 m).   Weight as of this encounter: 117.935 kg (260 lb).   Imaging Review Plain radiographs demonstrate severe degenerative joint disease of the right knee(s). The overall alignment issignificant varus. The bone quality appears to be good for age and reported activity level.  Assessment/Plan:  End stage arthritis, right knee   The patient history, physical examination, clinical judgment of the provider and imaging studies are consistent with end stage degenerative joint disease of the right knee(s) and total knee arthroplasty is deemed medically necessary. The treatment options including medical management, injection therapy arthroscopy and arthroplasty were discussed at length. The risks and benefits of total knee arthroplasty were presented and reviewed. The risks due to aseptic loosening, infection, stiffness, patella tracking problems, thromboembolic complications and other imponderables were discussed. The patient acknowledged the explanation, agreed to proceed with the plan and consent was signed. Patient is being admitted for inpatient treatment for surgery, pain control, PT, OT, prophylactic antibiotics, VTE prophylaxis, progressive ambulation and ADL's and discharge planning. The patient is planning to be discharged home with home health services  Stacy Mcatee A. Gwinda PasseShepperson, PA-C Physician Assistant [facility] [phone]  04/27/2013, 1:57 PM

## 2013-05-10 ENCOUNTER — Encounter (HOSPITAL_COMMUNITY): Payer: Self-pay | Admitting: Orthopedic Surgery

## 2013-05-10 LAB — CBC
HCT: 33.6 % — ABNORMAL LOW (ref 36.0–46.0)
Hemoglobin: 10.9 g/dL — ABNORMAL LOW (ref 12.0–15.0)
MCH: 30.4 pg (ref 26.0–34.0)
MCHC: 32.4 g/dL (ref 30.0–36.0)
MCV: 93.9 fL (ref 78.0–100.0)
PLATELETS: 249 10*3/uL (ref 150–400)
RBC: 3.58 MIL/uL — ABNORMAL LOW (ref 3.87–5.11)
RDW: 13.3 % (ref 11.5–15.5)
WBC: 11.6 10*3/uL — ABNORMAL HIGH (ref 4.0–10.5)

## 2013-05-10 LAB — BASIC METABOLIC PANEL
BUN: 13 mg/dL (ref 6–23)
CHLORIDE: 107 meq/L (ref 96–112)
CO2: 22 mEq/L (ref 19–32)
CREATININE: 0.44 mg/dL — AB (ref 0.50–1.10)
Calcium: 9 mg/dL (ref 8.4–10.5)
GFR calc non Af Amer: >90 mL/min (ref >90)
Glucose, Bld: 145 mg/dL — ABNORMAL HIGH (ref 70–99)
POTASSIUM: 4.3 meq/L (ref 3.7–5.3)
SODIUM: 141 meq/L (ref 137–147)

## 2013-05-10 MED ORDER — PNEUMOCOCCAL VAC POLYVALENT 25 MCG/0.5ML IJ INJ
0.5000 mL | INJECTION | INTRAMUSCULAR | Status: AC
  Administered 2013-05-11: 0.5 mL via INTRAMUSCULAR
  Filled 2013-05-10: qty 0.5

## 2013-05-10 NOTE — Progress Notes (Signed)
OT Cancellation Note  Patient Details Name: Orma Renderhan T Swindell MRN: [id number] DOB: 12-17-1945   Cancelled Treatment:    Reason Eval/Treat Not Completed: OT screened, no needs identified, will sign off;Other (comment) per PA-C note pt is discharging to SNF  Pilar GrammesKathryn H Saydi Kobel 05/10/2013, 10:40 AM

## 2013-05-10 NOTE — Progress Notes (Signed)
Subjective: 1 Day Post-Op Procedure(s) (LRB): TOTAL KNEE ARTHROPLASTY (Right) Patient reports pain as 5 on 0-10 scale.    Objective: Vital signs in last 24 hours: Temp:  [97.3 F (36.3 C)-98.2 F (36.8 C)] 97.7 F (36.5 C) (04/21 0502) Pulse Rate:  [66-85] 77 (04/21 0502) Resp:  [12-18] 14 (04/21 0502) BP: (112-146)/(47-76) 113/47 mmHg (04/21 0502) SpO2:  [92 %-100 %] 99 % (04/21 0502)  Intake/Output from previous day: 04/20 0701 - 04/21 0700 In: 2196.7 [P.O.:120; I.V.:1651.7; IV Piggyback:300] Out: 3000 [Urine:2900; Drains:50; Blood:50] Intake/Output this shift:     Recent Labs  05/10/13 0539  HGB 10.9*    Recent Labs  05/10/13 0539  WBC 11.6*  RBC 3.58*  HCT 33.6*  PLT 249    Recent Labs  05/10/13 0539  NA 141  K 4.3  CL 107  CO2 22  BUN 13  CREATININE 0.44*  GLUCOSE 145*  CALCIUM 9.0   No results found for this basename: LABPT, INR,  in the last 72 hours  Neurologically intact ABD soft Neurovascular intact Sensation intact distally Intact pulses distally Dorsiflexion/Plantar flexion intact Incision: dressing C/D/I  Assessment/Plan: 1 Day Post-Op Procedure(s) (LRB): TOTAL KNEE ARTHROPLASTY (Right) Advance diet Up with therapy D/C IV fluids Discharge to SNF on Thursday Dressing change today Drain pulled by me today  Husayn Reim J Dragan Tamburrino 05/10/2013, 7:45 AM

## 2013-05-10 NOTE — Progress Notes (Signed)
Physical Therapy Treatment Patient Details Name: Stacy Wilkinson T Bun MRN: [id number] DOB: Jun 23, 1945 Today's Date: 05/10/2013    History of Present Illness Pt admitted for RTKA with several recent falls due to knee buckling with resultant R humerus fx 02/18/13 and ORIF  pt reports 2nd RUE surgery 1 month ago to remove pins by Dr.Landau    PT Comments    Pt continues to progress well towards physical therapy goals, ambulating up to 100 feet with min guard however, demonstrating very antalgic style gait and flexes at trunk heavily for compensation. Good knee stability on operative knee with no instances of buckling. Pt will continue to benefit from skilled pt to focus on increasing level of independence with functional mobility.  Follow Up Recommendations  SNF;Supervision for mobility/OOB     Equipment Recommendations  None recommended by PT    Recommendations for Other Services       Precautions / Restrictions Precautions Precautions: Knee;Fall Precaution Comments: limited R shoulder ROM from fx- per pt report no weight bearing restrictions Required Braces or Orthoses: Knee Immobilizer - Right Knee Immobilizer - Right: On when out of bed or walking Restrictions Weight Bearing Restrictions: Yes RLE Weight Bearing: Weight bearing as tolerated    Mobility  Bed Mobility Overal bed mobility: Needs Assistance Bed Mobility: Supine to Sit;Sit to Supine     Supine to sit: Supervision;HOB elevated Sit to supine: Min assist   General bed mobility comments: Cues for technique with supine to sit and HOB elevated. Min assist with sit>supine and HOB flat to support RLE into bed.  Transfers Overall transfer level: Needs assistance Equipment used: Rolling walker (2 wheeled) Transfers: Sit to/from Stand Sit to Stand: Min guard         General transfer comment: Performed sit<>stand several times from lowest bed setting. Cues for correct placement of hands and RW. Tends to push RW away. Pt  prefers to rotate trunk to left and use RUE for balance on L hand grip of walker. Tends to perform safely but encouraging pt to perform in sagittal direction.  Ambulation/Gait Ambulation/Gait assistance: Min guard Ambulation Distance (Feet): 100 Feet Assistive device: Rolling walker (2 wheeled) Gait Pattern/deviations: Step-to pattern;Step-through pattern;Decreased step length - left;Decreased stance time - right;Antalgic;Trunk flexed     General Gait Details: Pt with more antalgic type deviation during ambulation this afternoon. She states she is in more pain and tends to flex at trunk in R stance phase. Verbal cues for upright posture, step-through pattern, and R quad activation in stance. Pt deomonstrating good knee stability with no instances of buckling.   Stairs            Wheelchair Mobility    Modified Rankin (Stroke Patients Only)       Balance                                    Cognition Arousal/Alertness: Awake/alert Behavior During Therapy: WFL for tasks assessed/performed Overall Cognitive Status: Within Functional Limits for tasks assessed                      Exercises Total Joint Exercises Quad Sets: AROM;Right;10 reps;Sidelying Heel Slides: AAROM;Right;10 reps;Supine (with hold at end range)    General Comments General comments (skin integrity, edema, etc.): Educated on precautions and reviewed exercises. Discussed use of CPM machine and importance of mobility during stay in hopsital due to complications after previous  surgeries. Pt reports L knee "stiffness" from sitting up in chair for prolonged period of time today. Was educated to perform exercises bil to improve function. Pt verbalizes understanding of importance to get OOB frequently during the day. She is agreeable to eat dinner in chair this evening and stay up in the chair for a couple of hours as tolerated.      Pertinent Vitals/Pain Pt states her pain is increased from  this AM however denies having nurse called to bring pain medication Pt repositioned in bed for comfort CPM on at 1545 from 0-65 degrees    Home Living                      Prior Function            PT Goals (current goals can now be found in the care plan section) Progress towards PT goals: Progressing toward goals    Frequency  7X/week    PT Plan Current plan remains appropriate    Co-evaluation             End of Session Equipment Utilized During Treatment: Gait belt;Right knee immobilizer Activity Tolerance: Patient tolerated treatment well Patient left: with call bell/phone within reach;in bed;in CPM     Time: 4098-11911515-1545 PT Time Calculation (min): 30 min  Charges:  $Gait Training: 8-22 mins $Self Care/Home Management: 8-22                    G Codes:      Charlsie MerlesLogan Secor Sava [place], [place] [phone]  Berton MountLogan S Almin Livingstone 05/10/2013, 4:05 PM

## 2013-05-10 NOTE — Clinical Social Work Placement (Addendum)
Clinical Social Work Department  CLINICAL SOCIAL WORK PLACEMENT NOTE  Patient:Stacy Wilkinson Account Number: [id number] Admit date: 05/09/13  Clinical Social Worker: Sabino Niemannmy Sequan Auxier LCSWA Date/time: 4/21/201511:30 AM  Clinical Social Work is seeking post-discharge placement for this patient at the following level of care: SKILLED NURSING (*CSW will update this form in Epic as items are completed)  4/21/2015Patient/family provided with [facility] of Clinical Social Work's list of facilities offering this level of care within the geographic area requested by the patient (or if unable, by the patient's family).  4/21/2015Patient/family informed of their freedom to choose among providers that offer the needed level of care, that participate in Medicare, Medicaid or managed care program needed by the patient, have an available bed and are willing to accept the patient.  05/10/2013 Patient/family informed of [facility]' ownership interest in [facility], as well as of the fact that they are under no obligation to receive care at this facility.  PASARR submitted to EDS on  PASARR number received from EDS on  FL2 transmitted to all facilities in geographic area requested by pt/family on 05/10/2013  FL2 transmitted to all facilities within larger geographic area on  Patient informed that his/her managed care company has contracts with or will negotiate with certain facilities, including the following:  Patient/family informed of bed offers received:  [facility] Physician recommends and patient chooses bed at  Patient to be transferred to on  Patient to be transferred to facility by  The following physician request were entered in Epic:  Additional Comments:

## 2013-05-10 NOTE — Progress Notes (Signed)
Physical Therapy Treatment Patient Details Name: Stacy Wilkinson T Lando MRN: [id number] DOB: January 02, 1946 Today's Date: 05/10/2013    History of Present Illness Pt admitted for RTKA with several recent falls due to knee buckling with resultant R humerus fx 02/18/13 and ORIF  pt reports 2nd RUE surgery 1 month ago to remove pins by Dr.Landau    PT Comments    Pt progressing nicely towards physical therapy goals, ambulating up to 85 feet with min guard, however very antalgic gait pattern due to decreased WB through right upper extremity, causing increased WB through her operative knee. No instances of buckling with knee immobilizer in place showing good stability. Pt will continue to benefit from skilled PT services to improve independence with functional mobility.  Follow Up Recommendations  SNF;Supervision for mobility/OOB     Equipment Recommendations  None recommended by PT    Recommendations for Other Services       Precautions / Restrictions Precautions Precautions: Knee;Fall Precaution Comments: limited R shoulder ROM from fx- per pt report no weight bearing restrictions Required Braces or Orthoses: Knee Immobilizer - Right Knee Immobilizer - Right: On when out of bed or walking Restrictions Weight Bearing Restrictions: Yes RLE Weight Bearing: Weight bearing as tolerated    Mobility  Bed Mobility                  Transfers Overall transfer level: Needs assistance Equipment used: Rolling walker (2 wheeled) Transfers: Sit to/from Stand Sit to Stand: Min assist         General transfer comment: Verbal cues for hand placement and positioning. Min assist for power-up lift.  Ambulation/Gait Ambulation/Gait assistance: Min guard Ambulation Distance (Feet): 85 Feet Assistive device: Rolling walker (2 wheeled) Gait Pattern/deviations: Step-to pattern;Step-through pattern;Decreased step length - left;Decreased stance time - right;Antalgic;Trunk flexed     General Gait  Details: Verbal cues for sequencing with min guard for safety. Therapy focused on emerging step-through gait pattern and is showing this intermittently. Very antalgic deviation as she bears little weight through RUE for support in R stance phase. Good control of knee in immobilizer with no instances of buckling.   Stairs            Wheelchair Mobility    Modified Rankin (Stroke Patients Only)       Balance                                    Cognition Arousal/Alertness: Awake/alert Behavior During Therapy: WFL for tasks assessed/performed Overall Cognitive Status: Within Functional Limits for tasks assessed                      Exercises Total Joint Exercises Ankle Circles/Pumps: AROM;Both;10 reps;Seated Quad Sets: AROM;Right;10 reps;Sidelying Heel Slides: AAROM;Right;10 reps;Seated (with hold at end range) Straight Leg Raises: AAROM;Right;10 reps;Seated Long Arc Quad: AROM;Right;10 reps;Seated    General Comments General comments (skin integrity, edema, etc.): Reviewed exercises, corrected technique. Pt states she is working much harder during her recovery from this surgery so she does not have complications similar to her previous surgery (PE).      Pertinent Vitals/Pain 3/10 pain - states nurse has given her pain medication prior to therapy Pt repositioned in chair for comfort; knee positioned for optimal extension    Home Living  Prior Function            PT Goals (current goals can now be found in the care plan section) Progress towards PT goals: Progressing toward goals    Frequency  7X/week    PT Plan Current plan remains appropriate    Co-evaluation             End of Session Equipment Utilized During Treatment: Gait belt;Right knee immobilizer Activity Tolerance: Patient tolerated treatment well Patient left: in chair;with call bell/phone within reach     Time: [id number] PT Time  Calculation (min): 24 min  Charges:  $Gait Training: 8-22 mins $Therapeutic Exercise: 8-22 mins                    G Codes:      Sunday SpillersLogan Secor [place], [place] [phone]  Berton MountLogan S Shatonya Passon 05/10/2013, 9:25 AM

## 2013-05-10 NOTE — Care Management Note (Signed)
CARE MANAGEMENT NOTE 05/10/2013  Patient:  Stacy Wilkinson,Stacy Wilkinson   Account Number:  [id number]  Date Initiated:  05/10/2013  Documentation initiated by:  Vance PeperBRADY,Andreka Stucki  Subjective/Objective Assessment:   68 yr old female s/p right total knee arthroplasty.     Action/Plan:   [facility] Social worker is aware. Patient was preoperatively setup with [facility].   Anticipated DC Date:  05/12/2013   Anticipated DC Plan:  SKILLED NURSING FACILITY  In-house referral  Clinical Social Worker      DC Planning Services  CM consult      Choice offered to / List presented to:             Status of service:  Completed, signed off Medicare Important Message given?   (If response is "NO", the following Medicare IM given date fields will be blank) Date Medicare IM given:   Date Additional Medicare IM given:    Discharge Disposition:  SKILLED NURSING FACILITY

## 2013-05-11 LAB — CBC
HCT: 30.7 % — ABNORMAL LOW (ref 36.0–46.0)
Hemoglobin: 9.9 g/dL — ABNORMAL LOW (ref 12.0–15.0)
MCH: 30.5 pg (ref 26.0–34.0)
MCHC: 32.2 g/dL (ref 30.0–36.0)
MCV: 94.5 fL (ref 78.0–100.0)
PLATELETS: 221 10*3/uL (ref 150–400)
RBC: 3.25 MIL/uL — ABNORMAL LOW (ref 3.87–5.11)
RDW: 13.6 % (ref 11.5–15.5)
WBC: 8.2 10*3/uL (ref 4.0–10.5)

## 2013-05-11 LAB — BASIC METABOLIC PANEL
BUN: 17 mg/dL (ref 6–23)
CHLORIDE: 111 meq/L (ref 96–112)
CO2: 22 mEq/L (ref 19–32)
Calcium: 8.6 mg/dL (ref 8.4–10.5)
Creatinine, Ser: 0.52 mg/dL (ref 0.50–1.10)
GFR calc non Af Amer: >90 mL/min (ref >90)
Glucose, Bld: 92 mg/dL (ref 70–99)
Potassium: 4.2 mEq/L (ref 3.7–5.3)
Sodium: 144 mEq/L (ref 137–147)

## 2013-05-11 NOTE — Progress Notes (Signed)
Subjective: 2 Days Post-Op Procedure(s) (LRB): TOTAL KNEE ARTHROPLASTY (Right) Patient reports pain as 3 on 0-10 scale.    Objective: Vital signs in last 24 hours: Temp:  [97.6 F (36.4 C)-98.2 F (36.8 C)] 97.6 F (36.4 C) (04/22 0518) Pulse Rate:  [77-80] 77 (04/22 0518) Resp:  [14-16] 14 (04/22 0518) BP: (106-123)/(43-53) 106/43 mmHg (04/22 0518) SpO2:  [97 %-100 %] 97 % (04/22 0518)  Intake/Output from previous day: 04/21 0701 - 04/22 0700 In: 960 [P.O.:960] Out: -  Intake/Output this shift:     Recent Labs  05/10/13 0539 05/11/13 0507  HGB 10.9* 9.9*    Recent Labs  05/10/13 0539 05/11/13 0507  WBC 11.6* 8.2  RBC 3.58* 3.25*  HCT 33.6* 30.7*  PLT 249 221    Recent Labs  05/10/13 0539 05/11/13 0507  NA 141 144  K 4.3 4.2  CL 107 111  CO2 22 22  BUN 13 17  CREATININE 0.44* 0.52  GLUCOSE 145* 92  CALCIUM 9.0 8.6   No results found for this basename: LABPT, INR,  in the last 72 hours  Neurologically intact ABD soft Neurovascular intact Sensation intact distally Intact pulses distally Dorsiflexion/Plantar flexion intact Incision: scant drainage  Assessment/Plan: 2 Days Post-Op Procedure(s) (LRB): TOTAL KNEE ARTHROPLASTY (Right) Advance diet Up with therapy D/C IV fluids Discharge to SNFtomorrow    [facility] if they have a bed Dressing changed by me today.  Antwione Picotte J Stefanie Hodgens 05/11/2013, 7:02 AM

## 2013-05-11 NOTE — Progress Notes (Signed)
Physical Therapy Treatment Patient Details Name: Stacy Wilkinson MRN: [id number] DOB: 05-24-1945 Today's Date: 05/11/2013    History of Present Illness Pt admitted for RTKA with several recent falls due to knee buckling with resultant R humerus fx 02/18/13 and ORIF  pt reports 2nd RUE surgery 1 month ago to remove pins by Dr.Landau    PT Comments    Patient is progressing towards goals. Patient able to perform gait training today without knee immobilizer and without buckling of knee. Patient states amb earlier with nursing staff. Patient required standing rest break and c/o slight dizziness. Patient is motivated for therapy. Continue plan of care. Recommend SNF at this time to help A with R TKA while previous injuries continue to limit her functional independence.   Follow Up Recommendations  SNF;Supervision for mobility/OOB     Equipment Recommendations  None recommended by PT    Recommendations for Other Services       Precautions / Restrictions Precautions Precautions: Knee;Fall Precaution Comments: limited R shoulder ROM from fx- per pt report no weight bearing restrictions Restrictions Weight Bearing Restrictions: Yes RLE Weight Bearing: Weight bearing as tolerated    Mobility  Bed Mobility               General bed mobility comments: Patient in recliner prior to therapy.  Transfers Overall transfer level: Needs assistance Equipment used: Rolling walker (2 wheeled) Transfers: Sit to/from Stand Sit to Stand: Min guard         General transfer comment: Patient able to correctly demonstrate sit to stand x 3 times. Patient continues to push RW away with sit to stand. Patient encouraged to keep it in front of them entire time.   Ambulation/Gait Ambulation/Gait assistance: Min guard Ambulation Distance (Feet): 100 Feet Assistive device: Rolling walker (2 wheeled) Gait Pattern/deviations: Step-to pattern;Decreased stride length;Trunk flexed;Antalgic     General  Gait Details: Patient required cues to relax shoulders. Pateint tends to destribute weight through UE  to decrease weight on LE.   Stairs            Wheelchair Mobility    Modified Rankin (Stroke Patients Only)       Balance                                    Cognition Arousal/Alertness: Awake/alert Behavior During Therapy: WFL for tasks assessed/performed Overall Cognitive Status: Within Functional Limits for tasks assessed                      Exercises Total Joint Exercises Ankle Circles/Pumps: AROM;Both;10 reps;Seated Quad Sets: AROM;Right;10 reps;Seated Heel Slides: AAROM;Right;5 reps;Seated    General Comments        Pertinent Vitals/Pain Patient denies pain but c/o dizziness with therapy. Nurse was notified.     Home Living                      Prior Function            PT Goals (current goals can now be found in the care plan section) Progress towards PT goals: Progressing toward goals    Frequency  7X/week    PT Plan Current plan remains appropriate    Co-evaluation             End of Session Equipment Utilized During Treatment: Gait belt Activity Tolerance: Patient tolerated treatment well Patient left: in  chair;with call bell/phone within reach     Time: 1024-1054 PT Time Calculation (min): 30 min  Charges:                       G Codes:      Athalee Esterline L Ziv Welchel, SPTA 05/11/2013, 10:59 AM

## 2013-05-11 NOTE — Clinical Social Work Psychosocial (Signed)
Clinical Social Work Department  BRIEF PSYCHOSOCIAL ASSESSMENT  Patient: Stacy Wilkinson  Account Number: [id number]   Admit date: 05/09/13 Clinical Social Worker Rhea Pink, MSW Date/Time: 05/10/13  Referred by: Physician Date Referred: NA Referred for   SNF Placement   Other Referral:  Interview type: Patient  Other interview type: PSYCHOSOCIAL DATA  Living Status: Alone and sometimes with her son Admitted from facility:  Level of care:  Primary support name: Oo Zaw Primary support relationship to patient: Son Degree of support available:  Strong and vested  CURRENT CONCERNS  Current Concerns   Post-Acute Placement   Other Concerns:  SOCIAL WORK ASSESSMENT / PLAN  CSW met with pt to offer support and discuss SNF placement. Patient was very jovial and reported that she lives alone and is going to need more rehab to get stronger. Patient reported that her son is helpful but she will need more help Juliani he can provide re: PT recommendation for SNF.   Pt lives alone  CSW explained placement process and answered questions.   Pt reports [employer] [employer]  as her preference    CSW completed FL2 and initiated SNF search.     Assessment/plan status: Information/Referral to [employer]  Other assessment/ plan:  Information/referral to community resources:  SNF     PATIENT'S/FAMILY'S RESPONSE TO PLAN OF CARE:  Pt  reports she is agreeable to ST SNF in order to increase strength and independence with mobility prior to returning home  Pt verbalized understanding of placement process and appreciation for CSW assist.   Rhea Pink, MSW, [place]

## 2013-05-11 NOTE — Progress Notes (Signed)
Orthopedic Tech Progress Note Patient Details:  Stacy Wilkinson 04-May-1945 [id number]  Patient ID: Stacy Wilkinson, female   DOB: 04-May-1945, 68 y.o.   MRN: [id number] Placed pt's rle in cpm @ 0-65 degrees @ 1515  Stacy Wilkinson 05/11/2013, 3:17 PM

## 2013-05-11 NOTE — Progress Notes (Signed)
Seen and agreed 05/11/2013 Kerrilynn Derenzo Elizabeth Charleene Callegari PTA [phone] pager [phone] office    

## 2013-05-11 NOTE — Progress Notes (Signed)
Orthopedic Tech Progress Note Patient Details:  Jearlean T Kurihara 1945/03/15 [id number] On cpm at 8:00 pm RLE 0-65 Patient ID: Kinaya Corliss Blacker Holford, female   DOB: 1945/03/15, 68 y.o.   MRN: [id number]   Jennye Moccasinnthony Craig Magdaleno Lortie 05/11/2013, 7:59 PM

## 2013-05-12 ENCOUNTER — Encounter: Payer: Self-pay | Admitting: *Deleted

## 2013-05-12 ENCOUNTER — Encounter: Payer: Self-pay | Admitting: Adult Health

## 2013-05-12 ENCOUNTER — Non-Acute Institutional Stay (SKILLED_NURSING_FACILITY): Payer: Medicare Other | Admitting: Adult Health

## 2013-05-12 DIAGNOSIS — I10 Essential (primary) hypertension: Secondary | ICD-10-CM | POA: Diagnosis not present

## 2013-05-12 DIAGNOSIS — IMO0002 Reserved for concepts with insufficient information to code with codable children: Secondary | ICD-10-CM | POA: Diagnosis not present

## 2013-05-12 DIAGNOSIS — S42309D Unspecified fracture of shaft of humerus, unspecified arm, subsequent encounter for fracture with routine healing: Secondary | ICD-10-CM | POA: Diagnosis not present

## 2013-05-12 DIAGNOSIS — R269 Unspecified abnormalities of gait and mobility: Secondary | ICD-10-CM | POA: Diagnosis not present

## 2013-05-12 DIAGNOSIS — M62838 Other muscle spasm: Secondary | ICD-10-CM | POA: Insufficient documentation

## 2013-05-12 DIAGNOSIS — Z96659 Presence of unspecified artificial knee joint: Secondary | ICD-10-CM | POA: Diagnosis not present

## 2013-05-12 DIAGNOSIS — R32 Unspecified urinary incontinence: Secondary | ICD-10-CM | POA: Diagnosis not present

## 2013-05-12 DIAGNOSIS — Z86711 Personal history of pulmonary embolism: Secondary | ICD-10-CM | POA: Insufficient documentation

## 2013-05-12 DIAGNOSIS — M1710 Unilateral primary osteoarthritis, unspecified knee: Secondary | ICD-10-CM

## 2013-05-12 DIAGNOSIS — M6281 Muscle weakness (generalized): Secondary | ICD-10-CM | POA: Diagnosis not present

## 2013-05-12 DIAGNOSIS — R21 Rash and other nonspecific skin eruption: Secondary | ICD-10-CM | POA: Diagnosis not present

## 2013-05-12 DIAGNOSIS — R159 Full incontinence of feces: Secondary | ICD-10-CM | POA: Diagnosis not present

## 2013-05-12 DIAGNOSIS — M1711 Unilateral primary osteoarthritis, right knee: Secondary | ICD-10-CM

## 2013-05-12 DIAGNOSIS — M25569 Pain in unspecified knee: Secondary | ICD-10-CM | POA: Diagnosis not present

## 2013-05-12 DIAGNOSIS — Z471 Aftercare following joint replacement surgery: Secondary | ICD-10-CM | POA: Diagnosis not present

## 2013-05-12 DIAGNOSIS — K5900 Constipation, unspecified: Secondary | ICD-10-CM

## 2013-05-12 DIAGNOSIS — D62 Acute posthemorrhagic anemia: Secondary | ICD-10-CM | POA: Diagnosis not present

## 2013-05-12 LAB — CBC
HEMATOCRIT: 30.5 % — AB (ref 36.0–46.0)
Hemoglobin: 9.8 g/dL — ABNORMAL LOW (ref 12.0–15.0)
MCH: 30.5 pg (ref 26.0–34.0)
MCHC: 32.1 g/dL (ref 30.0–36.0)
MCV: 95 fL (ref 78.0–100.0)
Platelets: 227 10*3/uL (ref 150–400)
RBC: 3.21 MIL/uL — ABNORMAL LOW (ref 3.87–5.11)
RDW: 13.8 % (ref 11.5–15.5)
WBC: 6.4 10*3/uL (ref 4.0–10.5)

## 2013-05-12 LAB — BASIC METABOLIC PANEL
BUN: 20 mg/dL (ref 6–23)
CHLORIDE: 108 meq/L (ref 96–112)
CO2: 23 mEq/L (ref 19–32)
Calcium: 8.4 mg/dL (ref 8.4–10.5)
Creatinine, Ser: 0.58 mg/dL (ref 0.50–1.10)
GFR calc Af Amer: >90 mL/min (ref >90)
GFR calc non Af Amer: >90 mL/min (ref >90)
GLUCOSE: 96 mg/dL (ref 70–99)
POTASSIUM: 4.2 meq/L (ref 3.7–5.3)
Sodium: 143 mEq/L (ref 137–147)

## 2013-05-12 MED ORDER — LISINOPRIL 20 MG PO TABS: ORAL_TABLET | ORAL | Status: DC

## 2013-05-12 MED ORDER — RIVAROXABAN 10 MG PO TABS: ORAL_TABLET | ORAL | Status: DC

## 2013-05-12 MED ORDER — HYDROCODONE-ACETAMINOPHEN 10-325 MG PO TABS: ORAL_TABLET | ORAL | Status: DC

## 2013-05-12 MED ORDER — DSS 100 MG PO CAPS: ORAL_CAPSULE | ORAL | Status: AC

## 2013-05-12 MED ORDER — CELECOXIB 200 MG PO CAPS: 200.0000 mg | ORAL_CAPSULE | Freq: Every day | ORAL | Status: AC

## 2013-05-12 NOTE — Progress Notes (Signed)
Physical Therapy Treatment Patient Details Name: Stacy Wilkinson MRN: [id number] DOB: November 29, 1945 Today's Date: 05/12/2013    History of Present Illness Pt admitted for RTKA with several recent falls due to knee buckling with resultant R humerus fx 02/18/13 and ORIF  pt reports 2nd RUE surgery 1 month ago to remove pins by Dr.Landau    PT Comments    Nurse in room prior to therapy to discuss SNF d/c today. Patient motivated to go to SNF for ST and return home when safe since patient does not have A at home. Patient continues to progress with therapy. Patient able to tolerate increased gait training with no breaks or c/o dizziness. Continue plan of care. Recommend increase strengthening with supervision for functional independence.   Follow Up Recommendations  SNF;Supervision for mobility/OOB     Equipment Recommendations  None recommended by PT    Recommendations for Other Services       Precautions / Restrictions Precautions Precautions: Knee;Fall Precaution Comments: limited R shoulder ROM from fx- per pt report no weight bearing restrictions Restrictions Weight Bearing Restrictions: Yes RLE Weight Bearing: Weight bearing as tolerated    Mobility  Bed Mobility               General bed mobility comments: Patient in recliner prior to therapy.  Transfers Overall transfer level: Needs assistance Equipment used: Rolling walker (2 wheeled) Transfers: Sit to/from Stand Sit to Stand: Min guard         General transfer comment: Patient able to perform transfer but required cues with weight shift. Patient able to recall last session and kept RW in front during transfer.  Ambulation/Gait Ambulation/Gait assistance: Min guard Ambulation Distance (Feet): 300 Feet Assistive device: Rolling walker (2 wheeled) Gait Pattern/deviations: Step-to pattern;Decreased stride length;Wide base of support     General Gait Details: Patient required cues to relax shoulders.    Stairs             Wheelchair Mobility    Modified Rankin (Stroke Patients Only)       Balance                                    Cognition Arousal/Alertness: Awake/alert Behavior During Therapy: WFL for tasks assessed/performed Overall Cognitive Status: Within Functional Limits for tasks assessed                      Exercises Total Joint Exercises Ankle Circles/Pumps: AROM;Both;Seated;15 reps Quad Sets: AROM;Right;Seated;15 reps Heel Slides: AAROM;Right;Seated;10 reps Long Arc Quad: AROM;Right;Seated;15 reps    General Comments        Pertinent Vitals/Pain Patient reports 4/10 pain level. Nursing staff notified.     Home Living                      Prior Function            PT Goals (current goals can now be found in the care plan section) Progress towards PT goals: Progressing toward goals    Frequency  7X/week    PT Plan Current plan remains appropriate    Co-evaluation             End of Session Equipment Utilized During Treatment: Gait belt Activity Tolerance: Patient tolerated treatment well Patient left: in chair;with call bell/phone within reach     Time: 0843-0908 PT Time Calculation (min): 25 min  Charges:  G Codes:      Laylaa Guevarra L Murdis Flitton, SPTA 05/12/2013, 9:13 AM

## 2013-05-12 NOTE — Progress Notes (Signed)
Patient ID: Stacy Wilkinson, female   DOB: 02/12/1945, 68 y.o.   MRN: [id number]               PROGRESS NOTE  DATE: 05/12/2013  FACILITY: Nursing Home Location: [facility]  LEVEL OF CARE: SNF (31)  Acute Visit  CHIEF COMPLAINT:  Follow-up Hospitalization  HISTORY OF PRESENT ILLNESS: This is a 68 year old female who has been [facility] 05/12/13 [facility] with DJD S/P right total knee arthroplasty. She has been admitted for a short-term rehabilitation.  REASSESSMENT OF ONGOING PROBLEM(S):  HTN: Pt 's HTN remains stable.  Denies CP, sob, DOE, pedal edema, headaches, dizziness or visual disturbances.  No complications from the medications currently being used.  Last BP : 120/70  CONSTIPATION: The constipation remains stable. No complications from the medications presently being used. Patient denies ongoing constipation, abdominal pain, nausea or vomiting.  PULMONARY EMBOLISM: The pulmonary embolism remains stable. Patient is currently on anticoagulation.  Patient denies chest pain or shortness of breath. No complications reported from the anti-coagulation currently being used.  PAST MEDICAL HISTORY : Reviewed.  No changes/see problem list  CURRENT MEDICATIONS: Reviewed per MAR/see medication list  REVIEW OF SYSTEMS:  GENERAL: no change in appetite, no fatigue, no weight changes, no fever, chills or weakness RESPIRATORY: no cough, SOB, DOE, wheezing, hemoptysis CARDIAC: no chest pain, or palpitations GI: no abdominal pain, diarrhea, constipation, heart burn, nausea or vomiting  PHYSICAL EXAMINATION  GENERAL: no acute distress, obese EYES: conjunctivae normal, sclerae normal, normal eye lids NECK: supple, trachea midline, no neck masses, no thyroid tenderness, no thyromegaly LYMPHATICS: no LAN in the neck, no supraclavicular LAN RESPIRATORY: breathing is even & unlabored, BS CTAB CARDIAC: RRR, no murmur,no extra heart sounds, no  edema GI: abdomen soft, normal BS, no masses, no tenderness, no hepatomegaly, no splenomegaly EXTREMITIES: able to move all 4 extremities PSYCHIATRIC: the patient is alert & oriented to person, affect & behavior appropriate  LABS/RADIOLOGY: Labs reviewed: Basic Metabolic Panel:  Recent Labs  30/86/5704/21/15 0539 05/11/13 0507 05/12/13 0435  NA 141 144 143  K 4.3 4.2 4.2  CL 107 111 108  CO2 22 22 23   GLUCOSE 145* 92 96  BUN 13 17 20   CREATININE 0.44* 0.52 0.58  CALCIUM 9.0 8.6 8.4   Liver Function Tests:  Recent Labs  04/29/13 1247  AST 22  ALT 17  ALKPHOS 81  BILITOT 0.6  PROT 7.5  ALBUMIN 3.9   CBC:  Recent Labs  04/29/13 1247 05/10/13 0539 05/11/13 0507 05/12/13 0435  WBC 4.5 11.6* 8.2 6.4  NEUTROABS 2.3  --   --   --   HGB 13.4 10.9* 9.9* 9.8*  HCT 40.9 33.6* 30.7* 30.5*  MCV 94.0 93.9 94.5 95.0  PLT 269 249 221 227    ASSESSMENT/PLAN:  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

## 2013-05-12 NOTE — Discharge Summary (Addendum)
Patient ID: Stacy Wilkinson MRN: [id number] DOB/AGE: November 02, 1945 68 y.o.  Admit date: 05/09/2013 Discharge date: 05/12/2013  Admission Diagnoses:  Principal Problem:   Right knee DJD Active Problems:   PULMONARY EMBOLISM   OSTEOARTHRITIS   Fracture of humerus, proximal, right, closed   Hypertension   DJD (degenerative joint disease) of knee   Discharge Diagnoses:  Same  Past Medical History  Diagnosis Date  . Arthritis   . Hypertension   . Wears glasses   . Fracture of humerus, proximal, right, closed 02/18/2013  . Right knee DJD   . History of pulmonary embolus (PE)     after sx for fx humerous  . Incontinence of bowel   . Incontinence of urine     Surgeries: Procedure(s): TOTAL KNEE ARTHROPLASTY on 05/09/2013   Consultants:    Discharged Condition: Improved  Hospital Course: Stacy Wilkinson is an 68 y.o. female who was admitted 05/09/2013 for operative treatment ofRight knee DJD. Patient has severe unremitting pain that affects sleep, daily activities, and work/hobbies. After pre-op clearance the patient was taken to the operating room on 05/09/2013 and underwent  Procedure(s): TOTAL KNEE ARTHROPLASTY.    Patient was given perioperative antibiotics:     Anti-infectives   Start     Dose/Rate Route Frequency Ordered Stop   05/09/13 1215  ceFAZolin (ANCEF) IVPB 2 g/50 mL premix     2 g 100 mL/hr over 30 Minutes Intravenous Every 6 hours 05/09/13 1202 05/09/13 2121   05/09/13 1000  vancomycin (VANCOCIN) 1,000 mg in sodium chloride 0.9 % 250 mL IVPB  Status:  Discontinued     1,000 mg 250 mL/hr over 60 Minutes Intravenous Every 12 hours 05/09/13 0751 05/09/13 1202   05/09/13 0807  cefUROXime (ZINACEF) injection  Status:  Discontinued       As needed 05/09/13 0807 05/09/13 0935   05/09/13 0800  vancomycin (VANCOCIN) 500 mg in sodium chloride 0.9 % 100 mL IVPB  Status:  Discontinued     500 mg 100 mL/hr over 60 Minutes Intravenous  Once 05/09/13 0747 05/09/13 1200   05/09/13  0600  ceFAZolin (ANCEF) IVPB 2 g/50 mL premix     2 g 100 mL/hr over 30 Minutes Intravenous On call to O.R. 05/09/13 0555 05/09/13 0730   05/08/13 1138  ceFAZolin (ANCEF) IVPB 2 g/50 mL premix  Status:  Discontinued     2 g 100 mL/hr over 30 Minutes Intravenous On call to O.R. 05/08/13 1138 05/09/13 1202   05/08/13 1138  vancomycin (VANCOCIN) 1,500 mg in sodium chloride 0.9 % 500 mL IVPB  Status:  Discontinued     1,500 mg 250 mL/hr over 120 Minutes Intravenous On call to O.R. 05/08/13 1138 05/09/13 1202       Patient was given sequential compression devices, early ambulation, and chemoprophylaxis to prevent DVT.  Patient benefited maximally from hospital stay and there were no complications.    Recent vital signs:  Patient Vitals for the past 24 hrs:  BP Temp Temp src Pulse Resp SpO2  05/12/13 0507 98/41 mmHg 97.5 F (36.4 C) Oral 65 14 100 %  05/12/13 0400 - - - - 14 98 %  05/12/13 0000 - - - - 16 99 %  05/11/13 2101 101/45 mmHg - - - - -  05/11/13 2028 91/40 mmHg 98.8 F (37.1 C) Oral 76 18 99 %  05/11/13 2000 - - - - 16 98 %  05/11/13 1400 104/51 mmHg 97.9 F (36.6 C) Oral 83 20  100 %  05/11/13 1030 112/55 mmHg 98.1 F (36.7 C) Oral 76 20 100 %     Recent laboratory studies:   Recent Labs  05/11/13 0507 05/12/13 0435  WBC 8.2 6.4  HGB 9.9* 9.8*  HCT 30.7* 30.5*  PLT 221 227  NA 144 143  K 4.2 4.2  CL 111 108  CO2 22 23  BUN 17 20  CREATININE 0.52 0.58  GLUCOSE 92 96  CALCIUM 8.6 8.4     Discharge Medications:     Medication List    STOP taking these medications       aspirin 325 MG EC tablet     diphenhydramine-acetaminophen 25-500 MG Tabs  Commonly known as:  TYLENOL PM     fish oil-omega-3 fatty acids 1000 MG capsule     glucosamine-chondroitin 500-400 MG tablet     promethazine 25 MG tablet  Commonly known as:  PHENERGAN      TAKE these medications       celecoxib 200 MG capsule  Commonly known as:  CELEBREX  Take 1 capsule (200 mg  total) by mouth daily.     DSS 100 MG Caps  1 tab 2 times a day while on narcotics.  STOOL SOFTENER     HYDROcodone-acetaminophen 10-325 MG per tablet  Commonly known as:  NORCO  1-2 tablets every 4-6 hrs as needed for pain     lisinopril 20 MG tablet  Commonly known as:  PRINIVIL,ZESTRIL  1 tablet a day    Do not give if BP is less Tekisha 120/80     methocarbamol 500 MG tablet  Commonly known as:  ROBAXIN  Take 1 tablet (500 mg total) by mouth 4 (four) times daily.     multivitamin with minerals tablet  Take 1 tablet by mouth daily.     rivaroxaban 10 MG Tabs tablet  Commonly known as:  XARELTO  1 tab a day for the next 30 days to prevent blood clots        Diagnostic Studies: Dg Chest 2 View  04/29/2013   CLINICAL DATA:  Preop for total knee replacement.  Hypertension.  EXAM: CHEST  2 VIEW  COMPARISON:  08/16/2008  FINDINGS: Cardiac silhouette is normal in size. Aorta is tortuous. No mediastinal or hilar masses or adenopathy.  Clear lungs.  No pleural effusion or pneumothorax.  The bony thorax is diffusely demineralized. A medullary rod has been placed reduce the right proximal humerus fracture.  IMPRESSION: No active cardiopulmonary disease.   Electronically Signed   By: Amie Portlandavid  Ormond M.D.   On: 04/29/2013 13:42    Disposition: Stable to SNF  Discharge Orders   Future Orders Complete By Expires   Call MD / Call 911  As directed    Call MD / Call 911  As directed    Change dressing  As directed    Constipation Prevention  As directed    CPM  As directed    Diet - low sodium heart healthy  As directed    Discharge instructions  As directed    Do not put a pillow under the knee. Place it under the heel.  As directed    Increase activity slowly as tolerated  As directed    TED hose  As directed       Follow-up Information   Follow up with Nilda SimmerWAINER,ROBERT A, MD On 05/23/2013. (appt time 2:45pm , For suture removal, For wound re-check)    Specialty:  Orthopedic Surgery  Contact information:   [address] [place] [address] [phone]        Signed: Pascal Lux 05/12/2013, 8:58 AM

## 2013-05-12 NOTE — Progress Notes (Signed)
Seen and agreed 05/12/2013 Julia Elizabeth Robinette PTA [phone] pager [phone] office    

## 2013-05-16 ENCOUNTER — Other Ambulatory Visit: Payer: Self-pay | Admitting: *Deleted

## 2013-05-16 ENCOUNTER — Non-Acute Institutional Stay (SKILLED_NURSING_FACILITY): Payer: Medicare Other | Admitting: Internal Medicine

## 2013-05-16 DIAGNOSIS — M1710 Unilateral primary osteoarthritis, unspecified knee: Secondary | ICD-10-CM

## 2013-05-16 DIAGNOSIS — IMO0002 Reserved for concepts with insufficient information to code with codable children: Secondary | ICD-10-CM | POA: Diagnosis not present

## 2013-05-16 DIAGNOSIS — D62 Acute posthemorrhagic anemia: Secondary | ICD-10-CM

## 2013-05-16 DIAGNOSIS — M62838 Other muscle spasm: Secondary | ICD-10-CM

## 2013-05-16 DIAGNOSIS — I10 Essential (primary) hypertension: Secondary | ICD-10-CM | POA: Diagnosis not present

## 2013-05-16 DIAGNOSIS — M1711 Unilateral primary osteoarthritis, right knee: Secondary | ICD-10-CM

## 2013-05-16 MED ORDER — TRAMADOL HCL 50 MG PO TABS: ORAL_TABLET | ORAL | Status: DC

## 2013-05-16 NOTE — Progress Notes (Signed)
HISTORY & PHYSICAL  DATE: 05/16/2013   FACILITY: [facility]  LEVEL OF CARE: SNF (31)  ALLERGIES:  Allergies  Allergen Reactions  . Percocet [Oxycodone-Acetaminophen] Itching    Rash     CHIEF COMPLAINT:  Manage right knee osteoarthritis, acute blood loss anemia and hypertension  HISTORY OF PRESENT ILLNESS: Patient is a 68 year old Bur[place] female.  KNEE OSTEOARTHRITIS: Patient had a history of pain and functional disability in the knee due to end-stage osteoarthritis and has failed nonsurgical conservative treatments. Patient had worsening of pain with activity and weight bearing, pain that interfered with activities of daily living & pain with passive range of motion. Therefore patient underwent total knee arthroplasty and tolerated the procedure well. Patient is admitted to this facility for sort short-term rehabilitation. Patient denies knee pain.  ANEMIA: The anemia has been stable. The patient denies fatigue, melena or hematochezia. No complications from the medications currently being used. Postoperatively the patient suffered acute blood loss. Last hemoglobins are 9.8 and 9.9.  HTN: Pt 's HTN remains stable.  Denies CP, sob, DOE, pedal edema, headaches, dizziness or visual disturbances.  No complications from the medications currently being used.  Last BP : 93/66 & 120/70.  PAST MEDICAL HISTORY :  Past Medical History  Diagnosis Date  . Arthritis   . Hypertension   . Fracture of humerus, proximal, right, closed 02/18/2013  . Right knee DJD   . History of pulmonary embolus (PE)     after sx for fx humerous  . Incontinence of bowel   . Incontinence of urine     PAST SURGICAL HISTORY: Past Surgical History  Procedure Laterality Date  . Joint replacement  2010    Left TKR.  . Orif humerus fracture Right 02/18/2013    Procedure: OPEN TREATMENT RIGHT  HUMERAL SHAFT FRACTURE WITH HUMERAL ROD AND LOCKING SCREWS;  Surgeon: Eulas PostJoshua P Landau, MD;   Location: [facility];  Service: Orthopedics;  Laterality: Right;  . Total knee arthroplasty Right 05/09/2013    Procedure: TOTAL KNEE ARTHROPLASTY;  Surgeon: Nilda Simmerobert A Wainer, MD;  Location: [facility] OR;  Service: Orthopedics;  Laterality: Right;    SOCIAL HISTORY:  reports that she has never smoked. She does not have any smokeless tobacco history on file. She reports that she does not drink alcohol or use illicit drugs.  FAMILY HISTORY: None  CURRENT MEDICATIONS: Reviewed per MAR/see medication list  REVIEW OF SYSTEMS:  See HPI otherwise 14 point ROS is negative.  PHYSICAL EXAMINATION  VS:  See VS section  GENERAL: no acute distress, obese body habitus EYES: conjunctivae normal, sclerae normal, normal eye lids MOUTH/THROAT: lips without lesions,no lesions in the mouth,tongue is without lesions,uvula elevates in midline NECK: supple, trachea midline, no neck masses, no thyroid tenderness, no thyromegaly LYMPHATICS: no LAN in the neck, no supraclavicular LAN RESPIRATORY: breathing is even & unlabored, BS CTAB CARDIAC: RRR, no murmur,no extra heart sounds, +3 bilateral lower extremity edema GI:  ABDOMEN: abdomen soft, normal BS, no masses, no tenderness  LIVER/SPLEEN: no hepatomegaly, no splenomegaly MUSCULOSKELETAL: HEAD: normal to inspection & palpation BACK: no kyphosis, scoliosis or spinal processes tenderness EXTREMITIES: LEFT UPPER EXTREMITY: full range of motion, normal strength & tone RIGHT UPPER EXTREMITY:  full range of motion, normal strength & tone LEFT LOWER EXTREMITY:  full range of motion, normal strength & tone RIGHT LOWER EXTREMITY:   range of motion not tested due to surgery, normal strength & tone PSYCHIATRIC:  the patient is alert & oriented to person, affect & behavior appropriate  LABS/RADIOLOGY:  Labs reviewed: Basic Metabolic Panel:  Recent Labs  16/10/9602/21/15 0539 05/11/13 0507 05/12/13 0435  NA 141 144 143  K 4.3 4.2 4.2  CL 107 111 108  CO2  22 22 23   GLUCOSE 145* 92 96  BUN 13 17 20   CREATININE 0.44* 0.52 0.58  CALCIUM 9.0 8.6 8.4   Liver Function Tests:  Recent Labs  04/29/13 1247  AST 22  ALT 17  ALKPHOS 81  BILITOT 0.6  PROT 7.5  ALBUMIN 3.9   CBC:  Recent Labs  04/29/13 1247 05/10/13 0539 05/11/13 0507 05/12/13 0435  WBC 4.5 11.6* 8.2 6.4  NEUTROABS 2.3  --   --   --   HGB 13.4 10.9* 9.9* 9.8*  HCT 40.9 33.6* 30.7* 30.5*  MCV 94.0 93.9 94.5 95.0  PLT 269 249 221 227    CHEST  2 VIEW   COMPARISON:  08/16/2008   FINDINGS: Cardiac silhouette is normal in size. Aorta is tortuous. No mediastinal or hilar masses or adenopathy.   Clear lungs.  No pleural effusion or pneumothorax.   The bony thorax is diffusely demineralized. A medullary rod has been placed reduce the right proximal humerus fracture.   IMPRESSION: No active cardiopulmonary disease.    ASSESSMENT/PLAN:  Right knee osteoarthritis-status post right total knee arthroplasty. Continue rehabilitation. Acute blood loss anemia-recheck Hypertension-good control Muscle cramps-continue muscle relaxant. Check CBC.  I have reviewed patient's medical records received at admission/from hospitalization.  CPT CODE: [id number]  Angela CoxGayani Y Rosia Syme, MD [facility] [phone]

## 2013-05-16 NOTE — Telephone Encounter (Signed)
[facility] 

## 2013-05-18 ENCOUNTER — Non-Acute Institutional Stay (SKILLED_NURSING_FACILITY): Payer: Medicare Other | Admitting: Adult Health

## 2013-05-18 DIAGNOSIS — R21 Rash and other nonspecific skin eruption: Secondary | ICD-10-CM

## 2013-05-18 DIAGNOSIS — M25569 Pain in unspecified knee: Secondary | ICD-10-CM

## 2013-05-18 DIAGNOSIS — M25561 Pain in right knee: Secondary | ICD-10-CM

## 2013-05-23 DIAGNOSIS — Z471 Aftercare following joint replacement surgery: Secondary | ICD-10-CM | POA: Diagnosis not present

## 2013-05-23 DIAGNOSIS — Z96659 Presence of unspecified artificial knee joint: Secondary | ICD-10-CM | POA: Diagnosis not present

## 2013-05-26 ENCOUNTER — Encounter: Payer: Self-pay | Admitting: Adult Health

## 2013-05-26 DIAGNOSIS — R21 Rash and other nonspecific skin eruption: Secondary | ICD-10-CM | POA: Insufficient documentation

## 2013-05-26 DIAGNOSIS — M25561 Pain in right knee: Secondary | ICD-10-CM | POA: Insufficient documentation

## 2013-05-26 NOTE — Progress Notes (Signed)
Patient ID: Stacy Wilkinson, female   DOB: 08-18-1945, 68 y.o.   MRN: [id number]                 PROGRESS NOTE  DATE:  05/18/13  FACILITY: Nursing Home Location: [facility]  LEVEL OF CARE: SNF (31)  Acute Visit  CHIEF COMPLAINT:  Management of Right knee pain and rashes  HISTORY OF PRESENT ILLNESS: This is a 68 year old female who was noted to have erythematous rashes on her back. She complains that it is very itchy. Skin is warm and dry, skin is intact at the back. She had a recent right total knee arthroplasty. She takes Ultram 2 tablets pain control and reports being confused when she takes them.   PAST MEDICAL HISTORY : Reviewed.  No changes/see problem list  CURRENT MEDICATIONS: Reviewed per MAR/see medication list  REVIEW OF SYSTEMS:  GENERAL: no change in appetite, no fatigue, no weight changes, no fever, chills or weakness RESPIRATORY: no cough, SOB, DOE, wheezing, hemoptysis CARDIAC: no chest pain, or palpitations GI: no abdominal pain, diarrhea, constipation, heart burn, nausea or vomiting  PHYSICAL EXAMINATION  GENERAL: no acute distress, obese SKIN: erythematous rashes on back NECK: supple, trachea midline, no neck masses, no thyroid tenderness, no thyromegaly LYMPHATICS: no LAN in the neck, no supraclavicular LAN RESPIRATORY: breathing is even & unlabored, BS CTAB CARDIAC: RRR, no murmur,no extra heart sounds, no edema GI: abdomen soft, normal BS, no masses, no tenderness, no hepatomegaly, no splenomegaly EXTREMITIES: able to move all 4 extremities PSYCHIATRIC: the patient is alert & oriented to person, affect & behavior appropriate  LABS/RADIOLOGY: Labs reviewed: Basic Metabolic Panel:  Recent Labs  40/98/1104/21/15 0539 05/11/13 0507 05/12/13 0435  NA 141 144 143  K 4.3 4.2 4.2  CL 107 111 108  CO2 22 22 23   GLUCOSE 145* 92 96  BUN 13 17 20   CREATININE 0.44* 0.52 0.58  CALCIUM 9.0 8.6 8.4   Liver Function Tests:  Recent Labs   04/29/13 1247  AST 22  ALT 17  ALKPHOS 81  BILITOT 0.6  PROT 7.5  ALBUMIN 3.9   CBC:  Recent Labs  04/29/13 1247 05/10/13 0539 05/11/13 0507 05/12/13 0435  WBC 4.5 11.6* 8.2 6.4  NEUTROABS 2.3  --   --   --   HGB 13.4 10.9* 9.9* 9.8*  HCT 40.9 33.6* 30.7* 30.5*  MCV 94.0 93.9 94.5 95.0  PLT 269 249 221 227    ASSESSMENT/PLAN:  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

## 2013-05-27 ENCOUNTER — Encounter: Payer: Self-pay | Admitting: Adult Health

## 2013-05-27 ENCOUNTER — Non-Acute Institutional Stay (SKILLED_NURSING_FACILITY): Payer: Medicare Other | Admitting: Adult Health

## 2013-05-27 ENCOUNTER — Encounter: Payer: Self-pay | Admitting: *Deleted

## 2013-05-27 DIAGNOSIS — I10 Essential (primary) hypertension: Secondary | ICD-10-CM | POA: Diagnosis not present

## 2013-05-27 DIAGNOSIS — M1710 Unilateral primary osteoarthritis, unspecified knee: Secondary | ICD-10-CM | POA: Diagnosis not present

## 2013-05-27 DIAGNOSIS — M1711 Unilateral primary osteoarthritis, right knee: Secondary | ICD-10-CM

## 2013-05-27 DIAGNOSIS — Z86711 Personal history of pulmonary embolism: Secondary | ICD-10-CM | POA: Diagnosis not present

## 2013-05-27 DIAGNOSIS — D62 Acute posthemorrhagic anemia: Secondary | ICD-10-CM

## 2013-05-27 DIAGNOSIS — K5900 Constipation, unspecified: Secondary | ICD-10-CM

## 2013-05-27 DIAGNOSIS — IMO0002 Reserved for concepts with insufficient information to code with codable children: Secondary | ICD-10-CM

## 2013-05-27 NOTE — Progress Notes (Signed)
Patient ID: Stacy Wilkinson, female   DOB: 22-Aug-1945, 68 y.o.   MRN: [id number]                PROGRESS NOTE  DATE: 05/27/13  FACILITY: Nursing Home [facility]  LEVEL OF CARE: SNF (31)  Acute Visit  CHIEF COMPLAINT:  Discharge Notes  HISTORY OF PRESENT ILLNESS: This is a 68 year old female who is for discharge home with Home health PT, OT and Nursing. [facility] with DJD S/P right total knee arthroplasty. Patient was admitted to this facility for short-term rehabilitation after the patient's recent hospitalization.  Patient has completed SNF rehabilitation and therapy has cleared the patient for discharge.   REASSESSMENT OF ONGOING PROBLEM(S):  HTN: Pt 's HTN remains stable.  Denies CP, sob, DOE, pedal edema, headaches, dizziness or visual disturbances.  No complications from the medications currently being used.  Last BP : 130/86  ANEMIA: The anemia has been stable. The patient denies fatigue, melena or hematochezia. No complications from the medications currently being used. 4/15 hgb 10.4  CONSTIPATION: The constipation remains stable. No complications from the medications presently being used. Patient denies ongoing constipation, abdominal pain, nausea or vomiting.  PULMONARY EMBOLISM: The pulmonary embolism remains stable. Patient is currently on anticoagulation.  Patient denies chest pain or shortness of breath. No complications reported from the anti-coagulation currently being used.  PAST MEDICAL HISTORY : Reviewed.  No changes/see problem list  CURRENT MEDICATIONS: Reviewed per MAR/see medication list  REVIEW OF SYSTEMS:  GENERAL: no change in appetite, no fatigue, no weight changes, no fever, chills or weakness RESPIRATORY: no cough, SOB, DOE, wheezing, hemoptysis CARDIAC: no chest pain, or palpitations GI: no abdominal pain, diarrhea, constipation, heart burn, nausea or  vomiting  PHYSICAL EXAMINATION  GENERAL: no acute distress, obese EYES: conjunctivae normal, sclerae normal, normal eye lids NECK: supple, trachea midline, no neck masses, no thyroid tenderness, no thyromegaly RESPIRATORY: breathing is even & unlabored, BS CTAB CARDIAC: RRR, no murmur,no extra heart sounds, no edema GI: abdomen soft, normal BS, no masses, no tenderness, no hepatomegaly, no splenomegaly EXTREMITIES: able to move all 4 extremities PSYCHIATRIC: the patient is alert & oriented to person, affect & behavior appropriate  LABS/RADIOLOGY: 05/17/13 WBC 6.1 hemoglobin 10.4 hematocrit 34.3 Labs reviewed: Basic Metabolic Panel:  Recent Labs  44/03/4702/21/15 0539 05/11/13 0507 05/12/13 0435  NA 141 144 143  K 4.3 4.2 4.2  CL 107 111 108  CO2 22 22 23   GLUCOSE 145* 92 96  BUN 13 17 20   CREATININE 0.44* 0.52 0.58  CALCIUM 9.0 8.6 8.4   Liver Function Tests:  Recent Labs  04/29/13 1247  AST 22  ALT 17  ALKPHOS 81  BILITOT 0.6  PROT 7.5  ALBUMIN 3.9   CBC:  Recent Labs  04/29/13 1247 05/10/13 0539 05/11/13 0507 05/12/13 0435  WBC 4.5 11.6* 8.2 6.4  NEUTROABS 2.3  --   --   --   HGB 13.4 10.9* 9.9* 9.8*  HCT 40.9 33.6* 30.7* 30.5*  MCV 94.0 93.9 94.5 95.0  PLT 269 249 221 227    ASSESSMENT/PLAN:  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  Patient will receive home health PT,  OT and Nursing.   Total discharge time: Less Earlee 30 minutes Discharge time involved coordination of the discharge process with Child psychotherapistsocial worker, nursing staff and therapy department. Medical justification for home health services verified.   CPT CODE: [id number]  Ella BodoMonina Vargas - NP [facility] [phone]

## 2013-05-30 DIAGNOSIS — Z96659 Presence of unspecified artificial knee joint: Secondary | ICD-10-CM | POA: Diagnosis not present

## 2013-05-30 DIAGNOSIS — Z471 Aftercare following joint replacement surgery: Secondary | ICD-10-CM | POA: Diagnosis not present

## 2013-05-30 DIAGNOSIS — I10 Essential (primary) hypertension: Secondary | ICD-10-CM | POA: Diagnosis not present

## 2013-05-30 DIAGNOSIS — R262 Difficulty in walking, not elsewhere classified: Secondary | ICD-10-CM | POA: Diagnosis not present

## 2013-06-01 DIAGNOSIS — Z471 Aftercare following joint replacement surgery: Secondary | ICD-10-CM | POA: Diagnosis not present

## 2013-06-01 DIAGNOSIS — Z96659 Presence of unspecified artificial knee joint: Secondary | ICD-10-CM | POA: Diagnosis not present

## 2013-06-01 DIAGNOSIS — R262 Difficulty in walking, not elsewhere classified: Secondary | ICD-10-CM | POA: Diagnosis not present

## 2013-06-01 DIAGNOSIS — I10 Essential (primary) hypertension: Secondary | ICD-10-CM | POA: Diagnosis not present

## 2013-06-02 DIAGNOSIS — I10 Essential (primary) hypertension: Secondary | ICD-10-CM | POA: Diagnosis not present

## 2013-06-02 DIAGNOSIS — Z96659 Presence of unspecified artificial knee joint: Secondary | ICD-10-CM | POA: Diagnosis not present

## 2013-06-02 DIAGNOSIS — R262 Difficulty in walking, not elsewhere classified: Secondary | ICD-10-CM | POA: Diagnosis not present

## 2013-06-02 DIAGNOSIS — Z471 Aftercare following joint replacement surgery: Secondary | ICD-10-CM | POA: Diagnosis not present

## 2013-06-03 DIAGNOSIS — Z96659 Presence of unspecified artificial knee joint: Secondary | ICD-10-CM | POA: Diagnosis not present

## 2013-06-03 DIAGNOSIS — Z471 Aftercare following joint replacement surgery: Secondary | ICD-10-CM | POA: Diagnosis not present

## 2013-06-03 DIAGNOSIS — I10 Essential (primary) hypertension: Secondary | ICD-10-CM | POA: Diagnosis not present

## 2013-06-03 DIAGNOSIS — R262 Difficulty in walking, not elsewhere classified: Secondary | ICD-10-CM | POA: Diagnosis not present

## 2013-06-06 DIAGNOSIS — Z96659 Presence of unspecified artificial knee joint: Secondary | ICD-10-CM | POA: Diagnosis not present

## 2013-06-06 DIAGNOSIS — Z471 Aftercare following joint replacement surgery: Secondary | ICD-10-CM | POA: Diagnosis not present

## 2013-06-06 DIAGNOSIS — I10 Essential (primary) hypertension: Secondary | ICD-10-CM | POA: Diagnosis not present

## 2013-06-06 DIAGNOSIS — R262 Difficulty in walking, not elsewhere classified: Secondary | ICD-10-CM | POA: Diagnosis not present

## 2013-06-07 DIAGNOSIS — R262 Difficulty in walking, not elsewhere classified: Secondary | ICD-10-CM | POA: Diagnosis not present

## 2013-06-07 DIAGNOSIS — Z471 Aftercare following joint replacement surgery: Secondary | ICD-10-CM | POA: Diagnosis not present

## 2013-06-07 DIAGNOSIS — I10 Essential (primary) hypertension: Secondary | ICD-10-CM | POA: Diagnosis not present

## 2013-06-07 DIAGNOSIS — Z96659 Presence of unspecified artificial knee joint: Secondary | ICD-10-CM | POA: Diagnosis not present

## 2013-06-08 DIAGNOSIS — R269 Unspecified abnormalities of gait and mobility: Secondary | ICD-10-CM | POA: Diagnosis not present

## 2013-06-08 DIAGNOSIS — M1710 Unilateral primary osteoarthritis, unspecified knee: Secondary | ICD-10-CM | POA: Diagnosis not present

## 2013-06-08 DIAGNOSIS — M25569 Pain in unspecified knee: Secondary | ICD-10-CM | POA: Diagnosis not present

## 2013-06-08 DIAGNOSIS — Z96659 Presence of unspecified artificial knee joint: Secondary | ICD-10-CM | POA: Diagnosis not present

## 2013-06-14 DIAGNOSIS — Z96659 Presence of unspecified artificial knee joint: Secondary | ICD-10-CM | POA: Diagnosis not present

## 2013-06-14 DIAGNOSIS — M25569 Pain in unspecified knee: Secondary | ICD-10-CM | POA: Diagnosis not present

## 2013-06-14 DIAGNOSIS — R269 Unspecified abnormalities of gait and mobility: Secondary | ICD-10-CM | POA: Diagnosis not present

## 2013-06-16 DIAGNOSIS — M1710 Unilateral primary osteoarthritis, unspecified knee: Secondary | ICD-10-CM | POA: Diagnosis not present

## 2013-06-16 DIAGNOSIS — M25569 Pain in unspecified knee: Secondary | ICD-10-CM | POA: Diagnosis not present

## 2013-06-16 DIAGNOSIS — R269 Unspecified abnormalities of gait and mobility: Secondary | ICD-10-CM | POA: Diagnosis not present

## 2013-06-16 DIAGNOSIS — Z96659 Presence of unspecified artificial knee joint: Secondary | ICD-10-CM | POA: Diagnosis not present

## 2013-06-20 DIAGNOSIS — S42309A Unspecified fracture of shaft of humerus, unspecified arm, initial encounter for closed fracture: Secondary | ICD-10-CM | POA: Diagnosis not present

## 2013-06-21 DIAGNOSIS — M25569 Pain in unspecified knee: Secondary | ICD-10-CM | POA: Diagnosis not present

## 2013-06-21 DIAGNOSIS — R269 Unspecified abnormalities of gait and mobility: Secondary | ICD-10-CM | POA: Diagnosis not present

## 2013-06-21 DIAGNOSIS — M25619 Stiffness of unspecified shoulder, not elsewhere classified: Secondary | ICD-10-CM | POA: Diagnosis not present

## 2013-06-21 DIAGNOSIS — M1710 Unilateral primary osteoarthritis, unspecified knee: Secondary | ICD-10-CM | POA: Diagnosis not present

## 2013-06-21 DIAGNOSIS — S42293A Other displaced fracture of upper end of unspecified humerus, initial encounter for closed fracture: Secondary | ICD-10-CM | POA: Diagnosis not present

## 2013-06-21 DIAGNOSIS — M25519 Pain in unspecified shoulder: Secondary | ICD-10-CM | POA: Diagnosis not present

## 2013-06-21 DIAGNOSIS — M6281 Muscle weakness (generalized): Secondary | ICD-10-CM | POA: Diagnosis not present

## 2013-06-21 DIAGNOSIS — Z96659 Presence of unspecified artificial knee joint: Secondary | ICD-10-CM | POA: Diagnosis not present

## 2013-06-23 DIAGNOSIS — M25619 Stiffness of unspecified shoulder, not elsewhere classified: Secondary | ICD-10-CM | POA: Diagnosis not present

## 2013-06-23 DIAGNOSIS — S42293A Other displaced fracture of upper end of unspecified humerus, initial encounter for closed fracture: Secondary | ICD-10-CM | POA: Diagnosis not present

## 2013-06-23 DIAGNOSIS — R269 Unspecified abnormalities of gait and mobility: Secondary | ICD-10-CM | POA: Diagnosis not present

## 2013-06-23 DIAGNOSIS — M6281 Muscle weakness (generalized): Secondary | ICD-10-CM | POA: Diagnosis not present

## 2013-06-23 DIAGNOSIS — Z96659 Presence of unspecified artificial knee joint: Secondary | ICD-10-CM | POA: Diagnosis not present

## 2013-06-23 DIAGNOSIS — M25569 Pain in unspecified knee: Secondary | ICD-10-CM | POA: Diagnosis not present

## 2013-06-23 DIAGNOSIS — M25519 Pain in unspecified shoulder: Secondary | ICD-10-CM | POA: Diagnosis not present

## 2013-06-23 DIAGNOSIS — M1710 Unilateral primary osteoarthritis, unspecified knee: Secondary | ICD-10-CM | POA: Diagnosis not present

## 2013-06-27 DIAGNOSIS — M25519 Pain in unspecified shoulder: Secondary | ICD-10-CM | POA: Diagnosis not present

## 2013-06-27 DIAGNOSIS — S42293A Other displaced fracture of upper end of unspecified humerus, initial encounter for closed fracture: Secondary | ICD-10-CM | POA: Diagnosis not present

## 2013-06-27 DIAGNOSIS — M6281 Muscle weakness (generalized): Secondary | ICD-10-CM | POA: Diagnosis not present

## 2013-06-27 DIAGNOSIS — R269 Unspecified abnormalities of gait and mobility: Secondary | ICD-10-CM | POA: Diagnosis not present

## 2013-06-27 DIAGNOSIS — Z96659 Presence of unspecified artificial knee joint: Secondary | ICD-10-CM | POA: Diagnosis not present

## 2013-06-27 DIAGNOSIS — M25569 Pain in unspecified knee: Secondary | ICD-10-CM | POA: Diagnosis not present

## 2013-06-27 DIAGNOSIS — M25619 Stiffness of unspecified shoulder, not elsewhere classified: Secondary | ICD-10-CM | POA: Diagnosis not present

## 2013-06-27 DIAGNOSIS — M1710 Unilateral primary osteoarthritis, unspecified knee: Secondary | ICD-10-CM | POA: Diagnosis not present

## 2013-06-28 DIAGNOSIS — M25619 Stiffness of unspecified shoulder, not elsewhere classified: Secondary | ICD-10-CM | POA: Diagnosis not present

## 2013-06-28 DIAGNOSIS — M25569 Pain in unspecified knee: Secondary | ICD-10-CM | POA: Diagnosis not present

## 2013-06-28 DIAGNOSIS — M25519 Pain in unspecified shoulder: Secondary | ICD-10-CM | POA: Diagnosis not present

## 2013-06-28 DIAGNOSIS — M1710 Unilateral primary osteoarthritis, unspecified knee: Secondary | ICD-10-CM | POA: Diagnosis not present

## 2013-06-28 DIAGNOSIS — S42293A Other displaced fracture of upper end of unspecified humerus, initial encounter for closed fracture: Secondary | ICD-10-CM | POA: Diagnosis not present

## 2013-06-28 DIAGNOSIS — R269 Unspecified abnormalities of gait and mobility: Secondary | ICD-10-CM | POA: Diagnosis not present

## 2013-06-28 DIAGNOSIS — M6281 Muscle weakness (generalized): Secondary | ICD-10-CM | POA: Diagnosis not present

## 2013-06-28 DIAGNOSIS — Z96659 Presence of unspecified artificial knee joint: Secondary | ICD-10-CM | POA: Diagnosis not present

## 2013-06-30 DIAGNOSIS — M1710 Unilateral primary osteoarthritis, unspecified knee: Secondary | ICD-10-CM | POA: Diagnosis not present

## 2013-06-30 DIAGNOSIS — M25569 Pain in unspecified knee: Secondary | ICD-10-CM | POA: Diagnosis not present

## 2013-06-30 DIAGNOSIS — Z96659 Presence of unspecified artificial knee joint: Secondary | ICD-10-CM | POA: Diagnosis not present

## 2013-06-30 DIAGNOSIS — R269 Unspecified abnormalities of gait and mobility: Secondary | ICD-10-CM | POA: Diagnosis not present

## 2013-07-05 ENCOUNTER — Other Ambulatory Visit: Payer: Self-pay | Admitting: Adult Health

## 2013-07-06 DIAGNOSIS — M1710 Unilateral primary osteoarthritis, unspecified knee: Secondary | ICD-10-CM | POA: Diagnosis not present

## 2013-07-06 DIAGNOSIS — Z96659 Presence of unspecified artificial knee joint: Secondary | ICD-10-CM | POA: Diagnosis not present

## 2013-07-06 DIAGNOSIS — R269 Unspecified abnormalities of gait and mobility: Secondary | ICD-10-CM | POA: Diagnosis not present

## 2013-07-06 DIAGNOSIS — M25569 Pain in unspecified knee: Secondary | ICD-10-CM | POA: Diagnosis not present

## 2013-07-07 DIAGNOSIS — M25519 Pain in unspecified shoulder: Secondary | ICD-10-CM | POA: Diagnosis not present

## 2013-07-07 DIAGNOSIS — M6281 Muscle weakness (generalized): Secondary | ICD-10-CM | POA: Diagnosis not present

## 2013-07-07 DIAGNOSIS — M25619 Stiffness of unspecified shoulder, not elsewhere classified: Secondary | ICD-10-CM | POA: Diagnosis not present

## 2013-07-07 DIAGNOSIS — S42293A Other displaced fracture of upper end of unspecified humerus, initial encounter for closed fracture: Secondary | ICD-10-CM | POA: Diagnosis not present

## 2013-07-12 DIAGNOSIS — M25569 Pain in unspecified knee: Secondary | ICD-10-CM | POA: Diagnosis not present

## 2013-07-12 DIAGNOSIS — S42293A Other displaced fracture of upper end of unspecified humerus, initial encounter for closed fracture: Secondary | ICD-10-CM | POA: Diagnosis not present

## 2013-07-12 DIAGNOSIS — M25619 Stiffness of unspecified shoulder, not elsewhere classified: Secondary | ICD-10-CM | POA: Diagnosis not present

## 2013-07-12 DIAGNOSIS — M1710 Unilateral primary osteoarthritis, unspecified knee: Secondary | ICD-10-CM | POA: Diagnosis not present

## 2013-07-12 DIAGNOSIS — Z96659 Presence of unspecified artificial knee joint: Secondary | ICD-10-CM | POA: Diagnosis not present

## 2013-07-12 DIAGNOSIS — R269 Unspecified abnormalities of gait and mobility: Secondary | ICD-10-CM | POA: Diagnosis not present

## 2013-07-12 DIAGNOSIS — M25519 Pain in unspecified shoulder: Secondary | ICD-10-CM | POA: Diagnosis not present

## 2013-07-12 DIAGNOSIS — M6281 Muscle weakness (generalized): Secondary | ICD-10-CM | POA: Diagnosis not present

## 2013-07-13 DIAGNOSIS — M1710 Unilateral primary osteoarthritis, unspecified knee: Secondary | ICD-10-CM | POA: Diagnosis not present

## 2013-07-13 DIAGNOSIS — S42293A Other displaced fracture of upper end of unspecified humerus, initial encounter for closed fracture: Secondary | ICD-10-CM | POA: Diagnosis not present

## 2013-07-13 DIAGNOSIS — M25569 Pain in unspecified knee: Secondary | ICD-10-CM | POA: Diagnosis not present

## 2013-07-13 DIAGNOSIS — M25519 Pain in unspecified shoulder: Secondary | ICD-10-CM | POA: Diagnosis not present

## 2013-07-13 DIAGNOSIS — Z96659 Presence of unspecified artificial knee joint: Secondary | ICD-10-CM | POA: Diagnosis not present

## 2013-07-13 DIAGNOSIS — R269 Unspecified abnormalities of gait and mobility: Secondary | ICD-10-CM | POA: Diagnosis not present

## 2013-07-13 DIAGNOSIS — M6281 Muscle weakness (generalized): Secondary | ICD-10-CM | POA: Diagnosis not present

## 2013-07-13 DIAGNOSIS — M25619 Stiffness of unspecified shoulder, not elsewhere classified: Secondary | ICD-10-CM | POA: Diagnosis not present

## 2013-07-19 DIAGNOSIS — M25519 Pain in unspecified shoulder: Secondary | ICD-10-CM | POA: Diagnosis not present

## 2013-07-19 DIAGNOSIS — M25619 Stiffness of unspecified shoulder, not elsewhere classified: Secondary | ICD-10-CM | POA: Diagnosis not present

## 2013-07-19 DIAGNOSIS — M6281 Muscle weakness (generalized): Secondary | ICD-10-CM | POA: Diagnosis not present

## 2013-07-19 DIAGNOSIS — S42293A Other displaced fracture of upper end of unspecified humerus, initial encounter for closed fracture: Secondary | ICD-10-CM | POA: Diagnosis not present

## 2013-07-20 DIAGNOSIS — R269 Unspecified abnormalities of gait and mobility: Secondary | ICD-10-CM | POA: Diagnosis not present

## 2013-07-20 DIAGNOSIS — M1710 Unilateral primary osteoarthritis, unspecified knee: Secondary | ICD-10-CM | POA: Diagnosis not present

## 2013-07-20 DIAGNOSIS — Z96659 Presence of unspecified artificial knee joint: Secondary | ICD-10-CM | POA: Diagnosis not present

## 2013-07-20 DIAGNOSIS — M25569 Pain in unspecified knee: Secondary | ICD-10-CM | POA: Diagnosis not present

## 2013-07-27 DIAGNOSIS — M6281 Muscle weakness (generalized): Secondary | ICD-10-CM | POA: Diagnosis not present

## 2013-07-27 DIAGNOSIS — M25619 Stiffness of unspecified shoulder, not elsewhere classified: Secondary | ICD-10-CM | POA: Diagnosis not present

## 2013-07-27 DIAGNOSIS — S42293A Other displaced fracture of upper end of unspecified humerus, initial encounter for closed fracture: Secondary | ICD-10-CM | POA: Diagnosis not present

## 2013-07-27 DIAGNOSIS — M25519 Pain in unspecified shoulder: Secondary | ICD-10-CM | POA: Diagnosis not present

## 2013-07-28 DIAGNOSIS — M1710 Unilateral primary osteoarthritis, unspecified knee: Secondary | ICD-10-CM | POA: Diagnosis not present

## 2013-07-28 DIAGNOSIS — R269 Unspecified abnormalities of gait and mobility: Secondary | ICD-10-CM | POA: Diagnosis not present

## 2013-07-28 DIAGNOSIS — M25569 Pain in unspecified knee: Secondary | ICD-10-CM | POA: Diagnosis not present

## 2013-07-28 DIAGNOSIS — Z96659 Presence of unspecified artificial knee joint: Secondary | ICD-10-CM | POA: Diagnosis not present

## 2013-08-01 DIAGNOSIS — S42293A Other displaced fracture of upper end of unspecified humerus, initial encounter for closed fracture: Secondary | ICD-10-CM | POA: Diagnosis not present

## 2013-08-01 DIAGNOSIS — M25519 Pain in unspecified shoulder: Secondary | ICD-10-CM | POA: Diagnosis not present

## 2013-08-01 DIAGNOSIS — M6281 Muscle weakness (generalized): Secondary | ICD-10-CM | POA: Diagnosis not present

## 2013-08-01 DIAGNOSIS — M25619 Stiffness of unspecified shoulder, not elsewhere classified: Secondary | ICD-10-CM | POA: Diagnosis not present

## 2013-08-02 DIAGNOSIS — S42209A Unspecified fracture of upper end of unspecified humerus, initial encounter for closed fracture: Secondary | ICD-10-CM | POA: Diagnosis not present

## 2013-08-03 DIAGNOSIS — Z96659 Presence of unspecified artificial knee joint: Secondary | ICD-10-CM | POA: Diagnosis not present

## 2013-08-03 DIAGNOSIS — M1710 Unilateral primary osteoarthritis, unspecified knee: Secondary | ICD-10-CM | POA: Diagnosis not present

## 2013-08-03 DIAGNOSIS — M25569 Pain in unspecified knee: Secondary | ICD-10-CM | POA: Diagnosis not present

## 2013-08-03 DIAGNOSIS — R269 Unspecified abnormalities of gait and mobility: Secondary | ICD-10-CM | POA: Diagnosis not present

## 2013-08-10 DIAGNOSIS — R269 Unspecified abnormalities of gait and mobility: Secondary | ICD-10-CM | POA: Diagnosis not present

## 2013-08-10 DIAGNOSIS — M1710 Unilateral primary osteoarthritis, unspecified knee: Secondary | ICD-10-CM | POA: Diagnosis not present

## 2013-08-10 DIAGNOSIS — M25569 Pain in unspecified knee: Secondary | ICD-10-CM | POA: Diagnosis not present

## 2013-08-10 DIAGNOSIS — Z96659 Presence of unspecified artificial knee joint: Secondary | ICD-10-CM | POA: Diagnosis not present

## 2013-09-27 DIAGNOSIS — M545 Low back pain, unspecified: Secondary | ICD-10-CM | POA: Diagnosis not present

## 2013-09-27 DIAGNOSIS — M25519 Pain in unspecified shoulder: Secondary | ICD-10-CM | POA: Diagnosis not present

## 2013-09-27 DIAGNOSIS — M542 Cervicalgia: Secondary | ICD-10-CM | POA: Diagnosis not present

## 2013-10-14 DIAGNOSIS — Z1231 Encounter for screening mammogram for malignant neoplasm of breast: Secondary | ICD-10-CM | POA: Diagnosis not present

## 2013-10-30 DIAGNOSIS — Z23 Encounter for immunization: Secondary | ICD-10-CM | POA: Diagnosis not present

## 2013-12-13 DIAGNOSIS — M545 Low back pain: Secondary | ICD-10-CM | POA: Diagnosis not present

## 2014-01-03 DIAGNOSIS — M47817 Spondylosis without myelopathy or radiculopathy, lumbosacral region: Secondary | ICD-10-CM | POA: Diagnosis not present

## 2014-01-03 DIAGNOSIS — M545 Low back pain: Secondary | ICD-10-CM | POA: Diagnosis not present

## 2014-01-09 DIAGNOSIS — M47816 Spondylosis without myelopathy or radiculopathy, lumbar region: Secondary | ICD-10-CM | POA: Diagnosis not present

## 2014-01-09 DIAGNOSIS — M545 Low back pain: Secondary | ICD-10-CM | POA: Diagnosis not present

## 2014-01-16 DIAGNOSIS — M545 Low back pain: Secondary | ICD-10-CM | POA: Diagnosis not present

## 2014-01-16 DIAGNOSIS — M47816 Spondylosis without myelopathy or radiculopathy, lumbar region: Secondary | ICD-10-CM | POA: Diagnosis not present

## 2014-01-25 DIAGNOSIS — M47816 Spondylosis without myelopathy or radiculopathy, lumbar region: Secondary | ICD-10-CM | POA: Diagnosis not present

## 2014-01-25 DIAGNOSIS — M545 Low back pain: Secondary | ICD-10-CM | POA: Diagnosis not present

## 2014-02-01 DIAGNOSIS — M47816 Spondylosis without myelopathy or radiculopathy, lumbar region: Secondary | ICD-10-CM | POA: Diagnosis not present

## 2014-02-01 DIAGNOSIS — M545 Low back pain: Secondary | ICD-10-CM | POA: Diagnosis not present

## 2014-03-08 DIAGNOSIS — E782 Mixed hyperlipidemia: Secondary | ICD-10-CM | POA: Diagnosis not present

## 2014-03-08 DIAGNOSIS — I10 Essential (primary) hypertension: Secondary | ICD-10-CM | POA: Diagnosis not present

## 2014-03-08 DIAGNOSIS — K219 Gastro-esophageal reflux disease without esophagitis: Secondary | ICD-10-CM | POA: Diagnosis not present

## 2014-03-08 DIAGNOSIS — M179 Osteoarthritis of knee, unspecified: Secondary | ICD-10-CM | POA: Diagnosis not present

## 2014-03-29 DIAGNOSIS — I10 Essential (primary) hypertension: Secondary | ICD-10-CM | POA: Diagnosis not present

## 2014-03-29 DIAGNOSIS — E782 Mixed hyperlipidemia: Secondary | ICD-10-CM | POA: Diagnosis not present

## 2014-03-29 DIAGNOSIS — R42 Dizziness and giddiness: Secondary | ICD-10-CM | POA: Diagnosis not present

## 2014-04-10 DIAGNOSIS — R42 Dizziness and giddiness: Secondary | ICD-10-CM | POA: Diagnosis not present

## 2014-04-10 DIAGNOSIS — I10 Essential (primary) hypertension: Secondary | ICD-10-CM | POA: Diagnosis not present

## 2014-04-10 DIAGNOSIS — E782 Mixed hyperlipidemia: Secondary | ICD-10-CM | POA: Diagnosis not present

## 2014-04-24 DIAGNOSIS — H11001 Unspecified pterygium of right eye: Secondary | ICD-10-CM | POA: Diagnosis not present

## 2014-07-11 DIAGNOSIS — H2513 Age-related nuclear cataract, bilateral: Secondary | ICD-10-CM | POA: Diagnosis not present

## 2014-10-13 ENCOUNTER — Ambulatory Visit (INDEPENDENT_AMBULATORY_CARE_PROVIDER_SITE_OTHER): Payer: Medicare Other | Admitting: Family Medicine

## 2014-10-13 VITALS — BP 126/68 | HR 93 | Temp 98.3°F | Resp 18 | Ht 62.5 in | Wt 288.0 lb

## 2014-10-13 DIAGNOSIS — G4700 Insomnia, unspecified: Secondary | ICD-10-CM

## 2014-10-13 DIAGNOSIS — R42 Dizziness and giddiness: Secondary | ICD-10-CM | POA: Diagnosis not present

## 2014-10-13 DIAGNOSIS — Z23 Encounter for immunization: Secondary | ICD-10-CM | POA: Diagnosis not present

## 2014-10-13 MED ORDER — LORAZEPAM 0.5 MG PO TABS: 0.5000 mg | ORAL_TABLET | Freq: Every evening | ORAL | Status: AC | PRN

## 2014-10-13 NOTE — Progress Notes (Signed)
This chart was scribed for Stacy Sidle, MD by Stann Ore, medical scribe at [facility].The patient was seen in exam room 8 and the patient's care was started at 5:24 PM.  Patient ID: Stacy Wilkinson MRN: [id number], DOB: Jun 04, 1945, 69 y.o. Date of Encounter: 10/13/2014  Primary Physician: Maryelizabeth Rowan, MD  Chief Complaint:  Chief Complaint  Patient presents with  . Hypertension    started this week; with an high reading of 156/95  . Dizziness    was dizzy and thought it was due to high blood pressure  . Flu Vaccine    HPI:  Stacy Wilkinson is a 69 y.o. female who presents to [facility] complaining of dizziness that starting last week.  She measured her BP with high reading of 156/95. She believes the dizziness is coming from her high BP. She rarely has high BP, but lately, she's been eating a little more sodium in her food. She denies ear pain, vomiting, nausea, cough, SOB, chest pain, fever, abdominal pain. She mentions having trouble sleeping. She would lay on the couch and watch TV until 1:00-2:00 AM when she's exhausted.   She had coughs last month. She also mentions returning from [place] a month ago, and had bad jet lag.   She also wants to receive a flu vaccine today.  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    Past Medical History  Diagnosis Date  . Arthritis   . Hypertension   . Fracture of humerus, proximal, right, closed 02/18/2013  . Right knee DJD   . History of pulmonary embolus (PE)     after sx for fx humerous  . Incontinence of bowel   . Incontinence of urine      Home Meds: Prior to Admission medications   Medication Sig Start Date End Date Taking? Authorizing Provider  aspirin 81 MG tablet Take 81 mg by mouth daily.   Yes Historical Provider, MD  Fish Oil OIL by Does not apply route.   Yes Historical Provider, MD  Multiple Vitamins-Minerals (MULTIVITAMIN WITH MINERALS) tablet Take 1 tablet by mouth daily.   Yes Historical  Provider, MD  celecoxib (CELEBREX) 200 MG capsule Take 1 capsule (200 mg total) by mouth daily. Patient not taking: Reported on 10/13/2014 05/12/13   Kirstin Shepperson, PA-C  docusate sodium 100 MG CAPS 1 tab 2 times a day while on narcotics.  STOOL SOFTENER Patient not taking: Reported on 10/13/2014 05/12/13   Kirstin Shepperson, PA-C  lisinopril (PRINIVIL,ZESTRIL) 20 MG tablet 1 tablet a day    Do not give if BP is less Jeany 120/80 Patient not taking: Reported on 10/13/2014 05/12/13   Kirstin Shepperson, PA-C  methocarbamol (ROBAXIN) 500 MG tablet Take 500 mg by mouth 4 (four) times daily. For muscle spams 02/18/13   Teryl Lucy, MD  rivaroxaban (XARELTO) 10 MG TABS tablet Take 10 mg by mouth daily. For the next 30 days to prevent blood clots 05/12/13   Kirstin Shepperson, PA-C    Allergies:  Allergies  Allergen Reactions  . Percocet [Oxycodone-Acetaminophen] Itching    Rash     Social History   Social History  . Marital Status: Divorced    Spouse Name: N/A  . Number of Children: N/A  . Years of Education: N/A   Occupational History  . Not on file.   Social History Main Topics  . Smoking status: Never Smoker   . Smokeless tobacco: Not on file  . Alcohol Use:  No  . Drug Use: No  . Sexual Activity: No   Other Topics Concern  . Not on file   Social History Narrative     Review of Systems: Constitutional: negative for chills, fever, night sweats, weight changes, or fatigue  HEENT: negative for vision changes, hearing loss, congestion, rhinorrhea, ST, epistaxis, or sinus pressure Cardiovascular: negative for chest pain or palpitations Respiratory: negative for hemoptysis, wheezing, shortness of breath, or cough Abdominal: negative for abdominal pain, nausea, vomiting, diarrhea, or constipation Dermatological: negative for rash Neurologic: negative for headache, or syncope; positive for dizziness All other systems reviewed and are otherwise negative with the exception to  those above and in the HPI.  Physical Exam: Blood pressure 126/68, pulse 93, temperature 98.3 F (36.8 C), temperature source Oral, resp. rate 18, height 5' 2.5" (1.588 m), weight 288 lb (130.636 kg), SpO2 97 %., Body mass index is 51.8 kg/(m^2). General: Well developed, well nourished, in no acute distress. Head: Normocephalic, atraumatic, eyes without discharge, sclera non-icteric, nares are without discharge. Bilateral auditory canals clear, TM's are without perforation, pearly grey and translucent with reflective cone of light bilaterally. Oral cavity moist, posterior pharynx without exudate, erythema, peritonsillar abscess, or post nasal drip.  Neck: Supple. No thyromegaly. Full ROM. No lymphadenopathy. Lungs: Clear bilaterally to auscultation without wheezes, rales, or rhonchi. Breathing is unlabored. Heart: RRR with S1 S2. No murmurs, rubs, or gallops appreciated. Abdomen: Soft, non-tender, non-distended with normoactive bowel sounds. No hepatomegaly. No rebound/guarding. No obvious abdominal masses. Msk:  Strength and tone normal for age. Extremities/Skin: Warm and dry. No clubbing or cyanosis. No edema. No rashes or suspicious lesions. Neuro: Alert and oriented X 3. Moves all extremities spontaneously. Gait is normal. CNII-XII grossly in tact. Psych:  Responds to questions appropriately with a normal affect.   Labs:  ASSESSMENT AND PLAN:  69 y.o. year old female with insomnia for a month following return air flight from [place].  I think this explains most of her malaise. This chart was scribed in my presence and reviewed by me personally.    ICD-9-CM ICD-10-CM   1. Need for prophylactic vaccination and inoculation against influenza V04.81 Z23 Flu Vaccine QUAD 36+ mos IM  2. Insomnia 780.52 G47.00 LORazepam (ATIVAN) 0.5 MG tablet  3. Dizziness and giddiness 780.4 R42 LORazepam (ATIVAN) 0.5 MG tablet      By signing my name below, I, Stann Ore, attest that this  documentation has been prepared under the direction and in the presence of Stacy Sidle, MD. Electronically Signed: Stann Ore, Scribe. 10/13/2014 , 5:24 PM .  Signed, Stacy Sidle, MD 10/13/2014 5:24 PM

## 2014-10-13 NOTE — Patient Instructions (Signed)
Insomnia Insomnia is frequent trouble falling and/or staying asleep. Insomnia can be a long term problem or a short term problem. Both are common. Insomnia can be a short term problem when the wakefulness is related to a certain stress or worry. Long term insomnia is often related to ongoing stress during waking hours and/or poor sleeping habits. Overtime, sleep deprivation itself can make the problem worse. Every little thing feels more severe because you are overtired and your ability to cope is decreased. CAUSES   Stress, anxiety, and depression.  Poor sleeping habits.  Distractions such as TV in the bedroom.  Naps close to bedtime.  Engaging in emotionally charged conversations before bed.  Technical reading before sleep.  Alcohol and other sedatives. They may make the problem worse. They can hurt normal sleep patterns and normal dream activity.  Stimulants such as caffeine for several hours prior to bedtime.  Pain syndromes and shortness of breath can cause insomnia.  Exercise late at night.  Changing time zones may cause sleeping problems (jet lag). It is sometimes helpful to have someone observe your sleeping patterns. They should look for periods of not breathing during the night (sleep apnea). They should also look to see how long those periods last. If you live alone or observers are uncertain, you can also be observed at a sleep clinic where your sleep patterns will be professionally monitored. Sleep apnea requires a checkup and treatment. Give your caregivers your medical history. Give your caregivers observations your family has made about your sleep.  SYMPTOMS   Not feeling rested in the morning.  Anxiety and restlessness at bedtime.  Difficulty falling and staying asleep. TREATMENT   Your caregiver may prescribe treatment for an underlying medical disorders. Your caregiver can give advice or help if you are using alcohol or other drugs for self-medication. Treatment  of underlying problems will usually eliminate insomnia problems.  Medications can be prescribed for short time use. They are generally not recommended for lengthy use.  Over-the-counter sleep medicines are not recommended for lengthy use. They can be habit forming.  You can promote easier sleeping by making lifestyle changes such as:  Using relaxation techniques that help with breathing and reduce muscle tension.  Exercising earlier in the day.  Changing your diet and the time of your last meal. No night time snacks.  Establish a regular time to go to bed.  Counseling can help with stressful problems and worry.  Soothing music and white noise may be helpful if there are background noises you cannot remove.  Stop tedious detailed work at least one hour before bedtime. HOME CARE INSTRUCTIONS   Keep a diary. Inform your caregiver about your progress. This includes any medication side effects. See your caregiver regularly. Take note of:  Times when you are asleep.  Times when you are awake during the night.  The quality of your sleep.  How you feel the next day. This information will help your caregiver care for you.  Get out of bed if you are still awake after 15 minutes. Read or do some quiet activity. Keep the lights down. Wait until you feel sleepy and go back to bed.  Keep regular sleeping and waking hours. Avoid naps.  Exercise regularly.  Avoid distractions at bedtime. Distractions include watching television or engaging in any intense or detailed activity like attempting to balance the household checkbook.  Develop a bedtime ritual. Keep a familiar routine of bathing, brushing your teeth, climbing into bed at the same   time each night, listening to soothing music. Routines increase the success of falling to sleep faster.  Use relaxation techniques. This can be using breathing and muscle tension release routines. It can also include visualizing peaceful scenes. You can  also help control troubling or intruding thoughts by keeping your mind occupied with boring or repetitive thoughts like the old concept of counting sheep. You can make it more creative like imagining planting one beautiful flower after another in your backyard garden.  During your day, work to eliminate stress. When this is not possible use some of the previous suggestions to help reduce the anxiety that accompanies stressful situations. MAKE SURE YOU:   Understand these instructions.  Will watch your condition.  Will get help right away if you are not doing well or get worse. Document Released: 01/04/2000 Document Revised: 03/31/2011 Document Reviewed: 02/03/2007 ExitCare Patient Information 2015 [employer]. This information is not intended to replace advice given to you by your health care provider. Make sure you discuss any questions you have with your health care provider.  

## 2014-10-23 DIAGNOSIS — E782 Mixed hyperlipidemia: Secondary | ICD-10-CM | POA: Diagnosis not present

## 2014-10-23 DIAGNOSIS — I10 Essential (primary) hypertension: Secondary | ICD-10-CM | POA: Diagnosis not present

## 2014-10-23 DIAGNOSIS — M179 Osteoarthritis of knee, unspecified: Secondary | ICD-10-CM | POA: Diagnosis not present

## 2015-01-03 DIAGNOSIS — Z1231 Encounter for screening mammogram for malignant neoplasm of breast: Secondary | ICD-10-CM | POA: Diagnosis not present

## 2015-01-08 DIAGNOSIS — Z96653 Presence of artificial knee joint, bilateral: Secondary | ICD-10-CM | POA: Diagnosis not present

## 2015-02-15 IMAGING — CR DG CHEST 2V
2 series · 2 of 2 positions shown · non-contrast
Comparison: 08/16/2008

CLINICAL DATA: Preop for total knee replacement.  Hypertension.

EXAM:
CHEST  2 VIEW

[w chest pa]
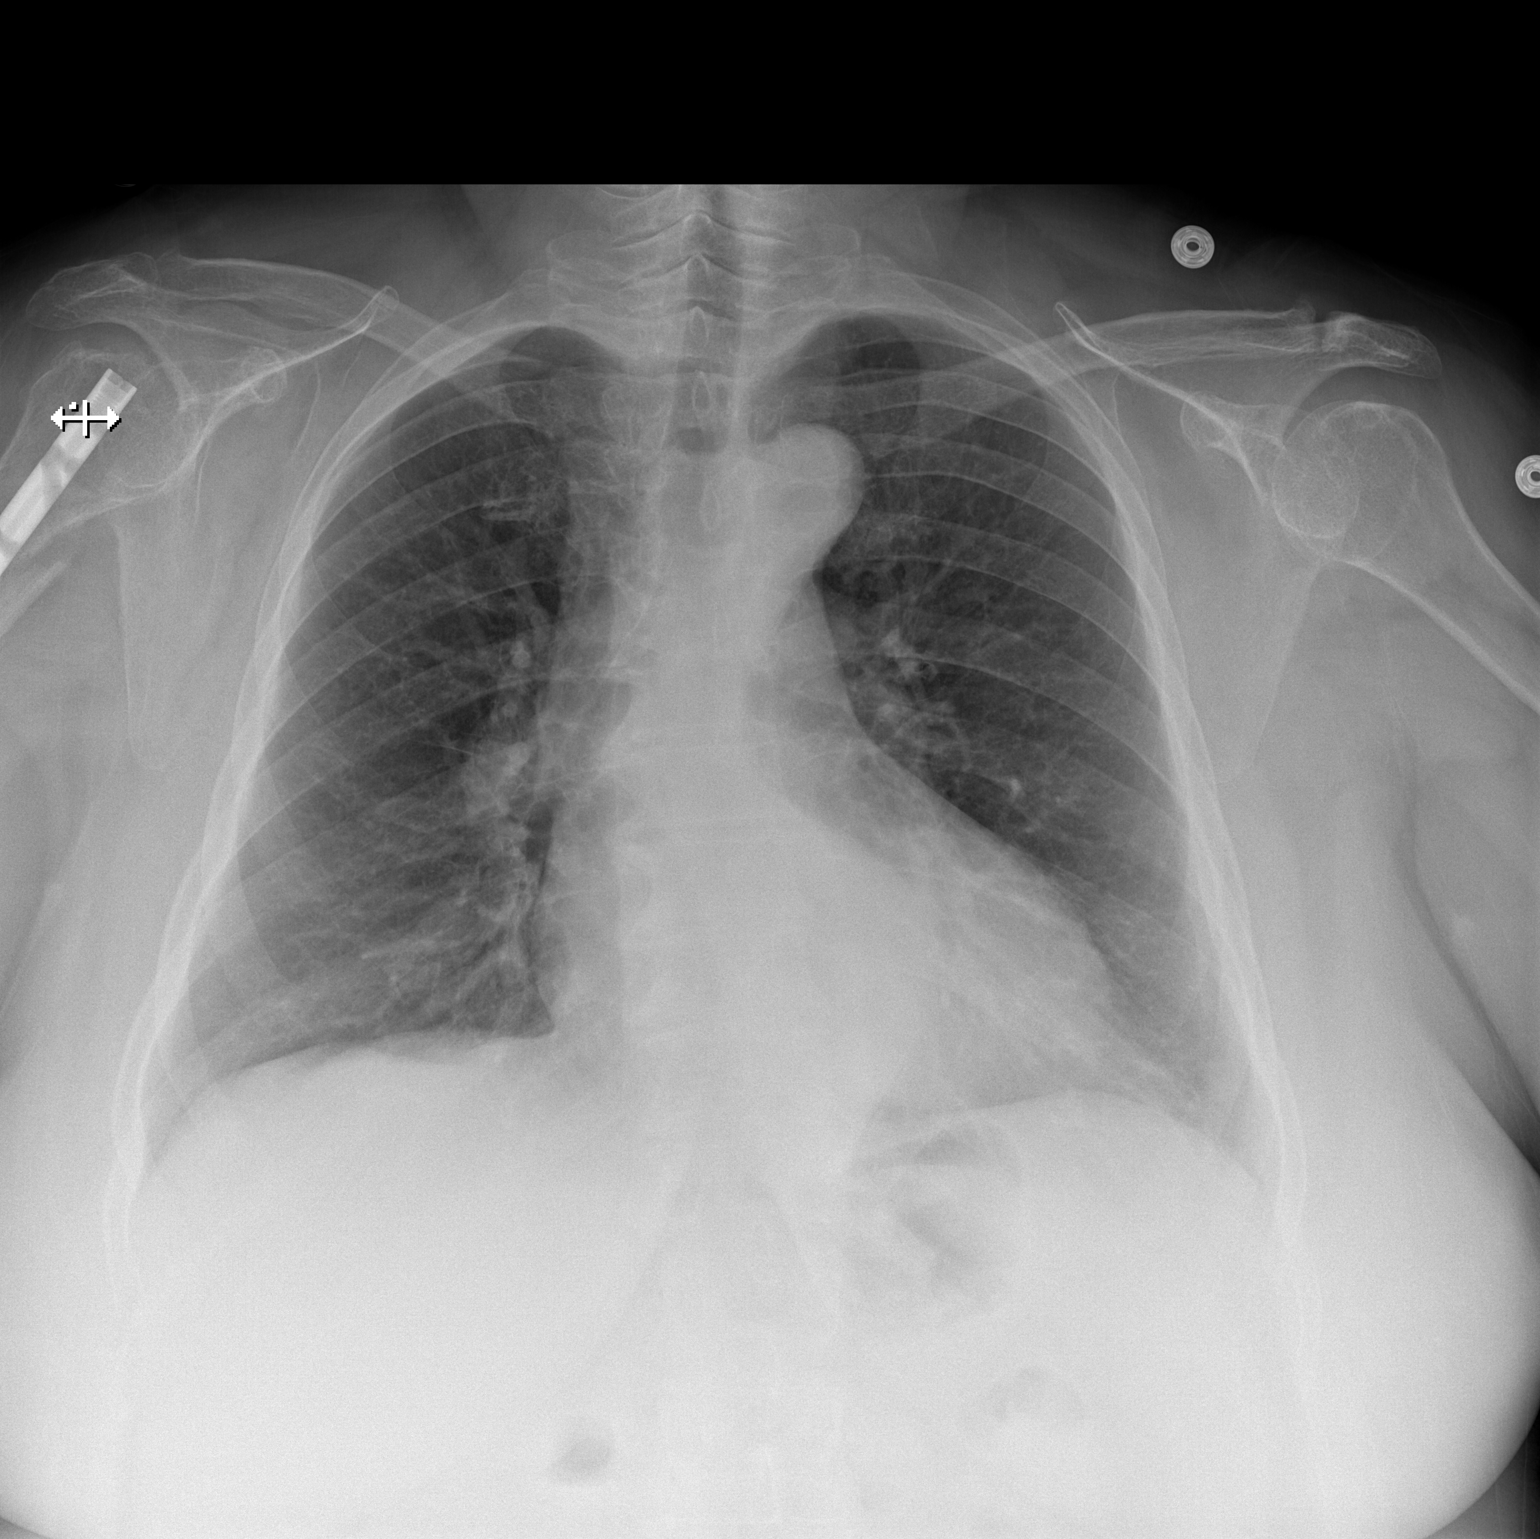

[w chest lat]
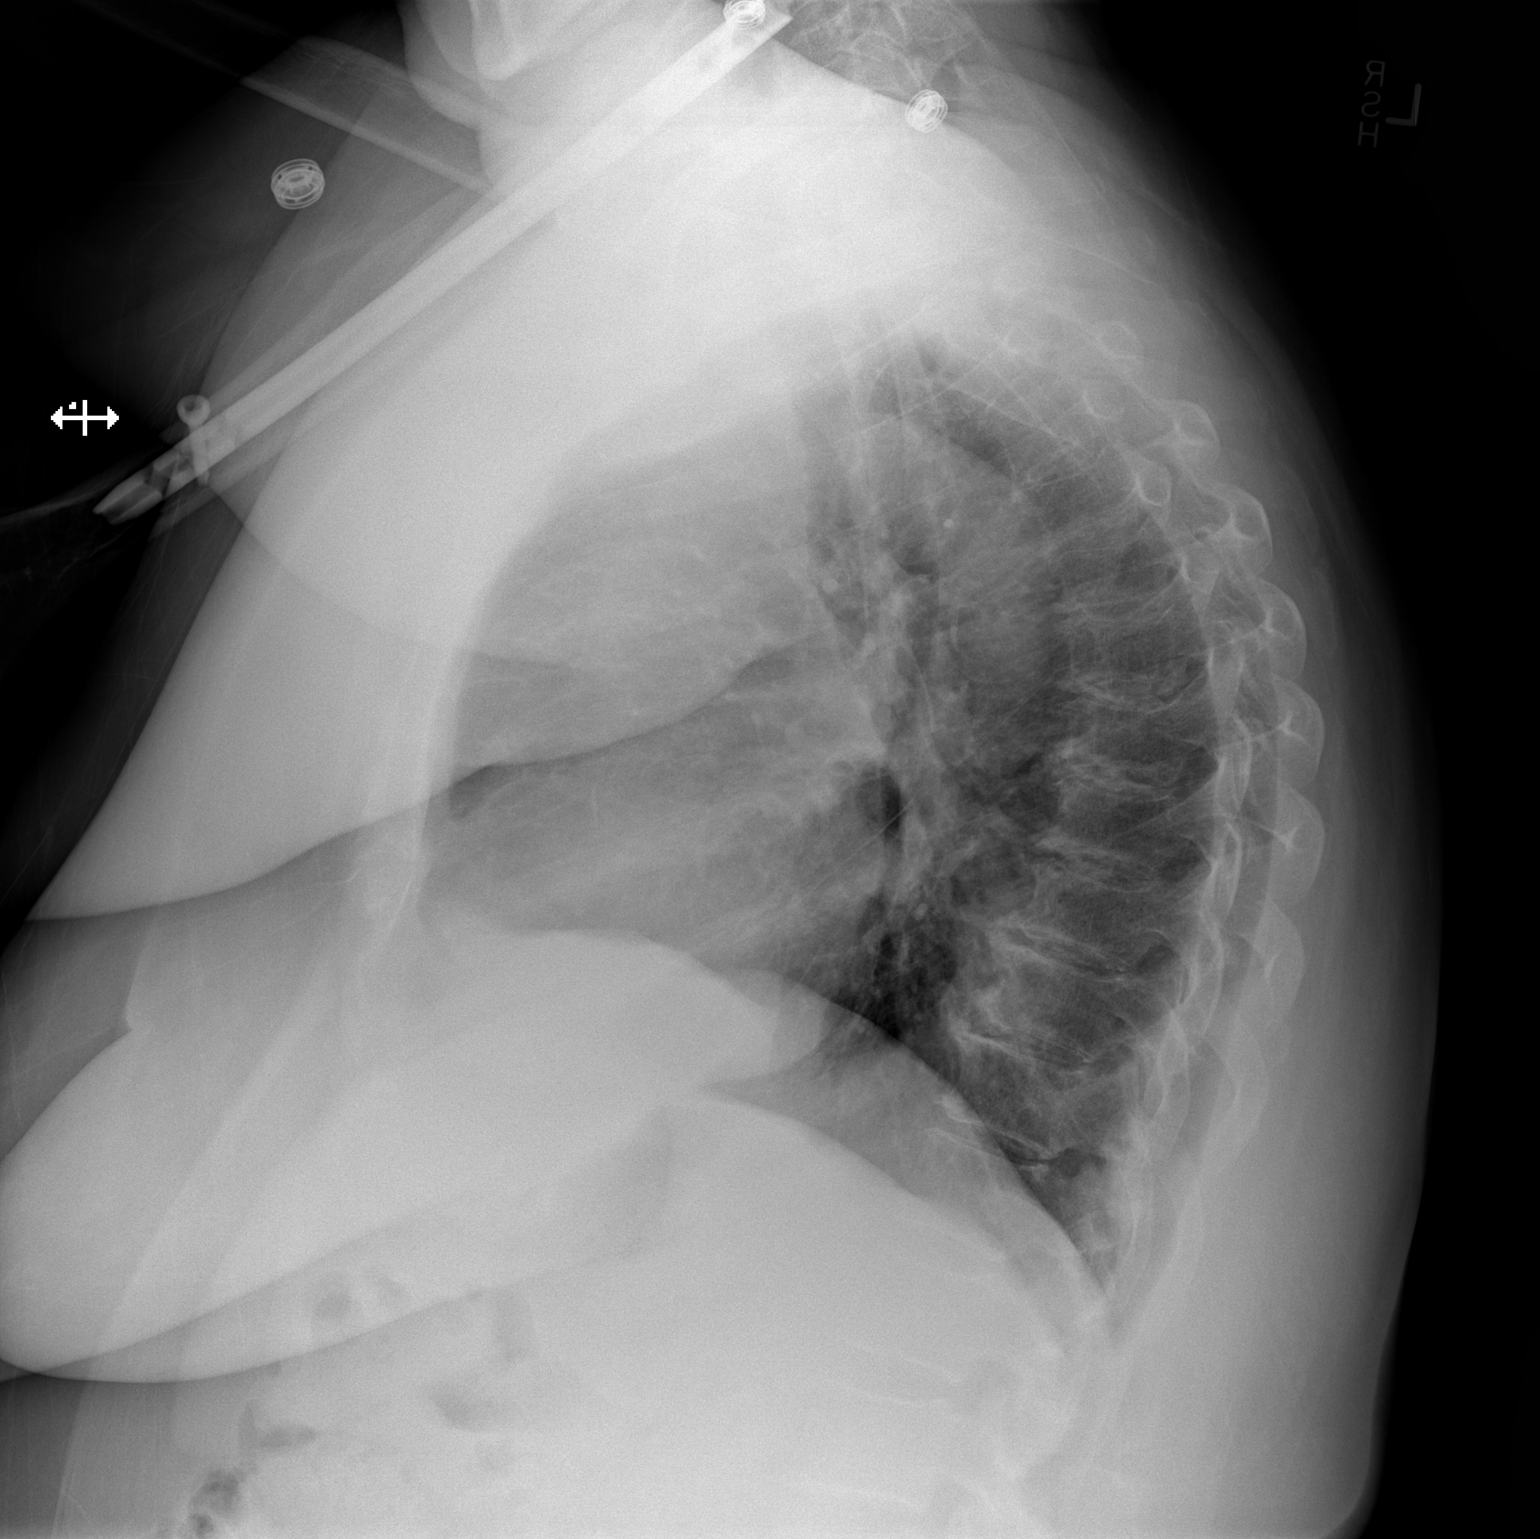

[2 of 2 positions shown; findings below may reference images not displayed]

FINDINGS: Cardiac silhouette is normal in size. Aorta is tortuous. No
mediastinal or hilar masses or adenopathy.

Clear lungs.  No pleural effusion or pneumothorax.

The bony thorax is diffusely demineralized. A medullary rod has been
placed reduce the right proximal humerus fracture.
IMPRESSION: No active cardiopulmonary disease.

## 2015-03-13 DIAGNOSIS — M545 Low back pain: Secondary | ICD-10-CM | POA: Diagnosis not present

## 2015-03-13 DIAGNOSIS — Z96653 Presence of artificial knee joint, bilateral: Secondary | ICD-10-CM | POA: Diagnosis not present

## 2015-05-15 ENCOUNTER — Ambulatory Visit (INDEPENDENT_AMBULATORY_CARE_PROVIDER_SITE_OTHER): Payer: Medicare Other | Admitting: Physician Assistant

## 2015-05-15 VITALS — BP 124/76 | HR 84 | Temp 98.1°F | Resp 18 | Wt 280.0 lb

## 2015-05-15 DIAGNOSIS — I10 Essential (primary) hypertension: Secondary | ICD-10-CM | POA: Diagnosis not present

## 2015-05-15 MED ORDER — LOSARTAN POTASSIUM-HCTZ 50-12.5 MG PO TABS: 0.5000 | ORAL_TABLET | Freq: Every day | ORAL | Status: AC

## 2015-05-15 NOTE — Patient Instructions (Signed)
     IF you received an x-ray today, you will receive an invoice from [facility]. Please contact [facility] at [phone] with questions or concerns regarding your invoice.   IF you received labwork today, you will receive an invoice from [facility]. Please contact [facility] at [phone] with questions or concerns regarding your invoice.   Our billing staff will not be able to assist you with questions regarding bills from these companies.  You will be contacted with the lab results as soon as they are available. The fastest way to get your results is to activate your My Chart account. Instructions are located on the last page of this paperwork. If you have not heard from us regarding the results in 2 weeks, please contact this office.      

## 2015-05-15 NOTE — Progress Notes (Signed)
   Stacy Wilkinson  MRN: [id number] DOB: 01-13-1946  Subjective:  Pt presentOrma Wilkinson to clinic for medication refill.  She ran out about 2 weeks ago and she had to change her PCP due to insurance change - she has an appt but she does not want to go without mediations until that appt.  She has checked her BP and it has been higher that normal.  She feels fine.  Appt in June with [facility] for her BP medications -   Patient Active Problem List   Diagnosis Date Noted  . History of pulmonary embolism 05/12/2013  . 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 Hypertension   . Fracture of humerus, proximal, right, closed 02/18/2013  . OSTEOARTHRITIS 08/31/2008    Current Outpatient Prescriptions on File Prior to Visit  Medication Sig Dispense Refill  . aspirin 81 MG tablet Take 81 mg by mouth daily.    . celecoxib (CELEBREX) 200 MG capsule Take 1 capsule (200 mg total) by mouth daily. 30 capsule 3  . docusate sodium 100 MG CAPS 1 tab 2 times a day while on narcotics.  STOOL SOFTENER 60 capsule 0  . Fish Oil OIL by Does not apply route.    Marland Kitchen. LORazepam (ATIVAN) 0.5 MG tablet Take 1 tablet (0.5 mg total) by mouth at bedtime as needed and may repeat dose one time if needed for anxiety. 20 tablet 1  . methocarbamol (ROBAXIN) 500 MG tablet Take 500 mg by mouth 4 (four) times daily. For muscle spams    . Multiple Vitamins-Minerals (MULTIVITAMIN WITH MINERALS) tablet Take 1 tablet by mouth daily.    . rivaroxaban (XARELTO) 10 MG TABS tablet Take 10 mg by mouth daily. For the next 30 days to prevent blood clots     No current facility-administered medications on file prior to visit.    Allergies  Allergen Reactions  . Percocet [Oxycodone-Acetaminophen] Itching    Rash     Review of Systems  Constitutional: Negative for fever and chills.  Respiratory: Negative for shortness of breath.   Cardiovascular: Negative for chest pain, palpitations and leg swelling.   Objective:  BP 124/76 mmHg  Pulse 84  Temp(Src) 98.1 F  (36.7 C) (Oral)  Resp 18  Wt 280 lb (127.007 kg)  SpO2 96%  Physical Exam  Constitutional: She is oriented to person, place, and time and well-developed, well-nourished, and in no distress.  HENT:  Head: Normocephalic and atraumatic.  Right Ear: Hearing and external ear normal.  Left Ear: Hearing and external ear normal.  Eyes: Conjunctivae are normal.  Neck: Normal range of motion.  Cardiovascular: Normal rate, regular rhythm and normal heart sounds.   No murmur heard. Pulmonary/Chest: Effort normal and breath sounds normal.  Musculoskeletal:       Right lower leg: She exhibits edema (1+).       Left lower leg: She exhibits edema (1+).  Neurological: She is alert and oriented to person, place, and time. Gait normal.  Skin: Skin is warm and dry.  Psychiatric: Mood, memory, affect and judgment normal.  Vitals reviewed.   Assessment and Plan :  Essential hypertension - Plan: losartan-hydrochlorothiazide (HYZAAR) 50-12.5 MG tablet   Refilled her medication - she will continue to monitor her BP for lows  Stacy LennertSarah Weber PA-C  [facility] 05/15/2015 5:36 PM

## 2015-09-10 ENCOUNTER — Encounter (HOSPITAL_BASED_OUTPATIENT_CLINIC_OR_DEPARTMENT_OTHER): Payer: Self-pay | Admitting: *Deleted

## 2015-09-10 ENCOUNTER — Emergency Department (HOSPITAL_BASED_OUTPATIENT_CLINIC_OR_DEPARTMENT_OTHER)
Admission: EM | Admit: 2015-09-10 | Discharge: 2015-09-10 | Disposition: A | Discharge status: Home / Self Care | Payer: Medicare Other | Attending: Emergency Medicine | Admitting: Emergency Medicine

## 2015-09-10 ENCOUNTER — Emergency Department (HOSPITAL_BASED_OUTPATIENT_CLINIC_OR_DEPARTMENT_OTHER): Payer: Medicare Other

## 2015-09-10 DIAGNOSIS — Z79899 Other long term (current) drug therapy: Secondary | ICD-10-CM | POA: Diagnosis not present

## 2015-09-10 DIAGNOSIS — S0003XA Contusion of scalp, initial encounter: Secondary | ICD-10-CM | POA: Diagnosis not present

## 2015-09-10 DIAGNOSIS — W19XXXA Unspecified fall, initial encounter: Secondary | ICD-10-CM

## 2015-09-10 DIAGNOSIS — S060X0A Concussion without loss of consciousness, initial encounter: Secondary | ICD-10-CM | POA: Diagnosis not present

## 2015-09-10 DIAGNOSIS — Y92810 Car as the place of occurrence of the external cause: Secondary | ICD-10-CM | POA: Insufficient documentation

## 2015-09-10 DIAGNOSIS — Y939 Activity, unspecified: Secondary | ICD-10-CM | POA: Insufficient documentation

## 2015-09-10 DIAGNOSIS — I10 Essential (primary) hypertension: Secondary | ICD-10-CM | POA: Diagnosis not present

## 2015-09-10 DIAGNOSIS — Y999 Unspecified external cause status: Secondary | ICD-10-CM | POA: Diagnosis not present

## 2015-09-10 DIAGNOSIS — Z7982 Long term (current) use of aspirin: Secondary | ICD-10-CM | POA: Insufficient documentation

## 2015-09-10 DIAGNOSIS — W01198A Fall on same level from slipping, tripping and stumbling with subsequent striking against other object, initial encounter: Secondary | ICD-10-CM | POA: Diagnosis not present

## 2015-09-10 DIAGNOSIS — R42 Dizziness and giddiness: Secondary | ICD-10-CM | POA: Diagnosis not present

## 2015-09-10 DIAGNOSIS — S0990XA Unspecified injury of head, initial encounter: Secondary | ICD-10-CM | POA: Diagnosis present

## 2015-09-10 MED ORDER — ACETAMINOPHEN 500 MG PO TABS
1000.0000 mg | ORAL_TABLET | Freq: Once | ORAL | Status: AC
  Administered 2015-09-10: 1000 mg via ORAL
  Filled 2015-09-10: qty 2

## 2015-09-10 NOTE — ED Provider Notes (Signed)
[facility] Provider Note   CSN: [id number] Arrival date & time: 09/10/15  1754  By signing my name below, I, Nelwyn SalisburyJoshua Fowler, attest that this documentation has been prepared under the direction and in the presence of Azalia BilisKevin Gazella Anglin, MD . Electronically Signed: Nelwyn SalisburyJoshua Fowler, Scribe. 09/10/2015. 6:23 PM.  History   Chief Complaint Chief Complaint  Patient presents with  . Fall   The history is provided by the patient. No language interpreter was used.     HPI Comments:  Stacy Wilkinson is a 70 y.o. female with PMHx of PE who presents to the Emergency Department complaining of constant unchanged headache s/p fall occuring earlier today. She reports associated resolved dizziness. She denies neck pain, arm pain, leg pain. No modifying factors indicated. Pt reports she was getting into her car when she slipped on a rock and fell backwards and hit the back of her head on concrete.  Past Medical History:  Diagnosis Date  . Arthritis   . Fracture of humerus, proximal, right, closed 02/18/2013  . History of pulmonary embolus (PE)    after sx for fx humerous  . Hypertension   . Incontinence of bowel   . Incontinence of urine   . Right knee DJD     Patient Active Problem List   Diagnosis Date Noted  . History of pulmonary embolism 05/12/2013  . 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 Hypertension   . Fracture of humerus, proximal, right, closed 02/18/2013  . OSTEOARTHRITIS 08/31/2008    Past Surgical History:  Procedure Laterality Date  . JOINT REPLACEMENT  2010   Left TKR.  Marland Kitchen. ORIF HUMERUS FRACTURE Right 02/18/2013   Procedure: OPEN TREATMENT RIGHT  HUMERAL SHAFT FRACTURE WITH HUMERAL ROD AND LOCKING SCREWS;  Surgeon: Eulas PostJoshua P Landau, MD;  Location: [facility];  Service: Orthopedics;  Laterality: Right;  . TOTAL KNEE ARTHROPLASTY Right 05/09/2013   Procedure: TOTAL KNEE ARTHROPLASTY;  Surgeon: Nilda Simmerobert A Wainer, MD;  Location: [facility] OR;  Service: Orthopedics;  Laterality:  Right;    OB History    No data available       Home Medications    Prior to Admission medications   Medication Sig Start Date End Date Taking? Authorizing Provider  aspirin 81 MG tablet Take 81 mg by mouth daily.    Historical Provider, MD  celecoxib (CELEBREX) 200 MG capsule Take 1 capsule (200 mg total) by mouth daily. 05/12/13   Kirstin Shepperson, PA-C  docusate sodium 100 MG CAPS 1 tab 2 times a day while on narcotics.  STOOL SOFTENER 05/12/13   Kirstin Shepperson, PA-C  Fish Oil OIL by Does not apply route.    Historical Provider, MD  LORazepam (ATIVAN) 0.5 MG tablet Take 1 tablet (0.5 mg total) by mouth at bedtime as needed and may repeat dose one time if needed for anxiety. 10/13/14   Elvina SidleKurt Lauenstein, MD  losartan-hydrochlorothiazide (HYZAAR) 50-12.5 MG tablet Take 0.5 tablets by mouth daily. 05/15/15   Morrell RiddleSarah L Weber, PA-C  methocarbamol (ROBAXIN) 500 MG tablet Take 500 mg by mouth 4 (four) times daily. For muscle spams 02/18/13   Teryl LucyJoshua Landau, MD  Multiple Vitamins-Minerals (MULTIVITAMIN WITH MINERALS) tablet Take 1 tablet by mouth daily.    Historical Provider, MD  rivaroxaban (XARELTO) 10 MG TABS tablet Take 10 mg by mouth daily. For the next 30 days to prevent blood clots 05/12/13   Kirstin Shepperson, PA-C    Family History No family history on file.  Social History  Social History  Substance Use Topics  . Smoking status: Never Smoker  . Smokeless tobacco: Not on file  . Alcohol use No     Allergies   Percocet [oxycodone-acetaminophen]   Review of Systems Review of Systems 10 Systems reviewed and are negative for acute change except as noted in the HPI.   Physical Exam Updated Vital Signs BP 139/84   Pulse 77   Temp 98.7 F (37.1 C)   Resp 18   Ht 5\' 4"  (1.626 m)   Wt 282 lb (127.9 kg)   SpO2 99%   BMI 48.41 kg/m   Physical Exam  Constitutional: She is oriented to person, place, and time. She appears well-developed and well-nourished.  HENT:  Head:  Normocephalic and atraumatic.  Eyes: Pupils are equal, round, and reactive to light.  Cardiovascular: Regular rhythm.   Pulmonary/Chest: Effort normal.  Abdominal: Soft.  Musculoskeletal: Normal range of motion.  Small posterior scalp hematoma without laceration. No C-spine tenderness.  Neurological: She is alert and oriented to person, place, and time.  5/5 strength in major muscle groups of  bilateral upper and lower extremities. Speech normal. No facial asymetry.   Nursing note and vitals reviewed.    ED Treatments / Results  DIAGNOSTIC STUDIES:  Oxygen Saturation is 99% on RA, normal by my interpretation.    COORDINATION OF CARE:  6:22 PM Discussed treatment plan with pt at bedside which included CT head and tylenol and pt agreed to plan.  Labs (all labs ordered are listed, but only abnormal results are displayed) Labs Reviewed - No data to display  EKG  EKG Interpretation None       Radiology No results found.  Procedures Procedures (including critical care time)  Medications Ordered in ED Medications - No data to display   Initial Impression / Assessment and Plan / ED Course  I have reviewed the triage vital signs and the nursing notes.  Pertinent labs & imaging results that were available during my care of the patient were reviewed by me and considered in my medical decision making (see chart for details).  Clinical Course    Patient is overall well-appearing.  Minor head injury.  Since the ER for head CT.  Head CT without acute pathology.  Likely mild concussion.  Concussion 1 a primary care follow-up.  She understands return to the ER for new or worsening symptoms    Final Clinical Impressions(s) / ED Diagnoses   Final diagnoses:  None    New Prescriptions New Prescriptions   No medications on file    I personally performed the services described in this documentation, which was scribed in my presence. The recorded information has been  reviewed and is accurate.        Azalia BilisKevin Devean Skoczylas, MD 09/10/15 [phone]

## 2015-09-10 NOTE — ED Triage Notes (Addendum)
Pt c/o h/a , dizziness fall hitting head on pavement x 4 hrs ago, sent here from PMD office for eval

## 2015-09-10 NOTE — ED Notes (Signed)
MD at bedside. 

## 2015-09-10 NOTE — ED Notes (Signed)
Pt reported HA started after she fell. Pt reported she fell about 1145 this morning, reports she was outside on her driveway and she fell backwards and hit her head. Reports about hour later she felt the pain go from the back of her head to the front of her head. Reported no dizziness prior to fall. Reports previous falls but never backward onto her head.

## 2015-09-13 ENCOUNTER — Other Ambulatory Visit: Payer: Self-pay | Admitting: Physician Assistant

## 2015-09-13 DIAGNOSIS — I10 Essential (primary) hypertension: Secondary | ICD-10-CM

## 2015-09-23 ENCOUNTER — Emergency Department (HOSPITAL_BASED_OUTPATIENT_CLINIC_OR_DEPARTMENT_OTHER)
Admission: EM | Admit: 2015-09-23 | Discharge: 2015-09-23 | Disposition: A | Discharge status: Home / Self Care | Payer: Medicare Other | Attending: Emergency Medicine | Admitting: Emergency Medicine

## 2015-09-23 ENCOUNTER — Encounter (HOSPITAL_BASED_OUTPATIENT_CLINIC_OR_DEPARTMENT_OTHER): Payer: Self-pay | Admitting: Emergency Medicine

## 2015-09-23 DIAGNOSIS — Y92810 Car as the place of occurrence of the external cause: Secondary | ICD-10-CM | POA: Insufficient documentation

## 2015-09-23 DIAGNOSIS — Y939 Activity, unspecified: Secondary | ICD-10-CM | POA: Diagnosis not present

## 2015-09-23 DIAGNOSIS — W01198A Fall on same level from slipping, tripping and stumbling with subsequent striking against other object, initial encounter: Secondary | ICD-10-CM | POA: Insufficient documentation

## 2015-09-23 DIAGNOSIS — Z7982 Long term (current) use of aspirin: Secondary | ICD-10-CM | POA: Diagnosis not present

## 2015-09-23 DIAGNOSIS — R42 Dizziness and giddiness: Secondary | ICD-10-CM | POA: Diagnosis present

## 2015-09-23 DIAGNOSIS — Y999 Unspecified external cause status: Secondary | ICD-10-CM | POA: Insufficient documentation

## 2015-09-23 DIAGNOSIS — G44309 Post-traumatic headache, unspecified, not intractable: Secondary | ICD-10-CM | POA: Insufficient documentation

## 2015-09-23 DIAGNOSIS — Z79899 Other long term (current) drug therapy: Secondary | ICD-10-CM | POA: Diagnosis not present

## 2015-09-23 DIAGNOSIS — F0781 Postconcussional syndrome: Secondary | ICD-10-CM | POA: Insufficient documentation

## 2015-09-23 DIAGNOSIS — I10 Essential (primary) hypertension: Secondary | ICD-10-CM | POA: Diagnosis not present

## 2015-09-23 MED ORDER — BUTALBITAL-APAP-CAFFEINE 50-325-40 MG PO TABS: 1.0000 | ORAL_TABLET | Freq: Four times a day (QID) | ORAL | 0 refills | Status: AC | PRN

## 2015-09-23 NOTE — ED Triage Notes (Addendum)
Patient was here on the 21st r/t a head injury. The patient states that she still has a heaviness to her head and wants to see someone about it. Patient also reports that she has a lot of tearing to her eyes

## 2015-09-23 NOTE — ED Notes (Signed)
Patient denies pain and is resting comfortably.  

## 2015-09-23 NOTE — ED Notes (Signed)
MD at bedside. 

## 2015-09-23 NOTE — ED Provider Notes (Signed)
[facility] Provider Note   CSN: [id number] Arrival date & time: 09/23/15  1522 By signing my name below, I, Stacy Wilkinson, attest that this documentation has been prepared under the direction and in the presence of Stacy Loveless, MD. Electronically Signed: Bridgette Wilkinson, ED Scribe. 09/23/15. 5:35 PM.  History   Chief Complaint Chief Complaint  Patient presents with  . Headache   HPI Comments: Stacy Wilkinson is a 70 y.o. female with h/o PE and HTN who presents to the Emergency Department complaining of sudden onset, constant, unchanging, dizziness s/p head injury two weeks ago. Pt notes she was getting into her car when she slipped on a rock and fell backwards, striking the back of her head on concrete. No LOC. Pt also has associated fatigue, headache, generalized weakness, and watery eyes. Denies modifying factors. Pt has taken Tylenol and Aleve with mild relief. Pt was seen here on 09/10/15 for her symptoms and had a head CT done which was unremarkable. Pt denies vomiting, diarrhea, photophobia, abdominal pain, chest pain, hematuria, hematochezia, fever, or any other associated symptoms.   Patient's med list notes xarelto but she has not taken this in about 2 years (was for post-knee surgery).   The history is provided by the patient. No language interpreter was used.    Past Medical History:  Diagnosis Date  . Arthritis   . Fracture of humerus, proximal, right, closed 02/18/2013  . History of pulmonary embolus (PE)    after sx for fx humerous  . Hypertension   . Incontinence of bowel   . Incontinence of urine   . Right knee DJD     Patient Active Problem List   Diagnosis Date Noted  . History of pulmonary embolism 05/12/2013  . 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 Hypertension   . Fracture of humerus, proximal, right, closed 02/18/2013  . OSTEOARTHRITIS 08/31/2008    Past Surgical History:  Procedure Laterality Date  . JOINT REPLACEMENT  2010   Left TKR.  Marland Kitchen ORIF HUMERUS  FRACTURE Right 02/18/2013   Procedure: OPEN TREATMENT RIGHT  HUMERAL SHAFT FRACTURE WITH HUMERAL ROD AND LOCKING SCREWS;  Surgeon: Eulas Post, MD;  Location: [facility];  Service: Orthopedics;  Laterality: Right;  . TOTAL KNEE ARTHROPLASTY Right 05/09/2013   Procedure: TOTAL KNEE ARTHROPLASTY;  Surgeon: Nilda Simmer, MD;  Location: [facility];  Service: Orthopedics;  Laterality: Right;    OB History    No data available       Home Medications    Prior to Admission medications   Medication Sig Start Date End Date Taking? Authorizing Provider  aspirin 81 MG tablet Take 81 mg by mouth daily.    Historical Provider, MD  butalbital-acetaminophen-caffeine (FIORICET) 50-325-40 MG tablet Take 1 tablet by mouth every 6 (six) hours as needed for headache. 09/23/15 09/22/16  Stacy Loveless, MD  celecoxib (CELEBREX) 200 MG capsule Take 1 capsule (200 mg total) by mouth daily. 05/12/13   Kirstin Shepperson, PA-C  docusate sodium 100 MG CAPS 1 tab 2 times a day while on narcotics.  STOOL SOFTENER 05/12/13   Kirstin Shepperson, PA-C  Fish Oil OIL by Does not apply route.    Historical Provider, MD  LORazepam (ATIVAN) 0.5 MG tablet Take 1 tablet (0.5 mg total) by mouth at bedtime as needed and may repeat dose one time if needed for anxiety. 10/13/14   Elvina Sidle, MD  losartan-hydrochlorothiazide (HYZAAR) 50-12.5 MG tablet Take 0.5 tablets by mouth daily. 05/15/15  Morrell RiddleSarah L Weber, PA-C  methocarbamol (ROBAXIN) 500 MG tablet Take 500 mg by mouth 4 (four) times daily. For muscle spams 02/18/13   Teryl LucyJoshua Landau, MD  Multiple Vitamins-Minerals (MULTIVITAMIN WITH MINERALS) tablet Take 1 tablet by mouth daily.    Historical Provider, MD    Family History History reviewed. No pertinent family history.  Social History Social History  Substance Use Topics  . Smoking status: Never Smoker  . Smokeless tobacco: Never Used  . Alcohol use No     Allergies   Percocet  [oxycodone-acetaminophen]   Review of Systems Review of Systems  Constitutional: Positive for fatigue. Negative for fever.  Eyes: Negative for photophobia.  Cardiovascular: Negative for chest pain.  Gastrointestinal: Negative for abdominal pain, diarrhea, nausea and vomiting.  Genitourinary: Negative for hematuria.  Neurological: Positive for dizziness, weakness and headaches.     Physical Exam Updated Vital Signs BP 146/77 (BP Location: Right Arm)   Pulse 70   Temp 98.9 F (37.2 C) (Oral)   Resp 18   Ht 5\' 4"  (1.626 m)   Wt 282 lb (127.9 kg)   SpO2 97%   BMI 48.41 kg/m   Physical Exam  Constitutional: She is oriented to person, place, and time. She appears well-developed and well-nourished.  HENT:  Head: Normocephalic and atraumatic.  Right Ear: External ear normal.  Left Ear: External ear normal.  Nose: Nose normal.  Eyes: EOM are normal. Pupils are equal, round, and reactive to light. Right eye exhibits no discharge. Left eye exhibits no discharge.  Neck: Normal range of motion. Neck supple.  Cardiovascular: Normal rate, regular rhythm and normal heart sounds.   Pulmonary/Chest: Effort normal and breath sounds normal.  Abdominal: Soft. There is no tenderness.  Neurological: She is alert and oriented to person, place, and time.  CN 3-12 grossly intact. 5/5 strength in all 4 extremities. Grossly normal sensation. Normal finger to nose.   Skin: Skin is warm and dry.  Nursing note and vitals reviewed.    ED Treatments / Results  DIAGNOSTIC STUDIES: Oxygen Saturation is 100% on RA, normal by my interpretation.    COORDINATION OF CARE: 5:32 PM Discussed treatment plan with pt at bedside which includes pain Rx and pt agreed to plan.  Labs (all labs ordered are listed, but only abnormal results are displayed) Labs Reviewed - No data to display  EKG  EKG Interpretation None       Radiology No results found.  Procedures Procedures (including critical care  time)  Medications Ordered in ED Medications - No data to display   Initial Impression / Assessment and Plan / ED Course  I have reviewed the triage vital signs and the nursing notes.  Pertinent labs & imaging results that were available during my care of the patient were reviewed by me and considered in my medical decision making (see chart for details).  Clinical Course    Patient's history and exam is consistent with a postconcussive syndrome. There are no alarming findings on history or physical that would suggest needing repeat imaging or other emergent imaging. She will be treated with headache medicine and follow-up with neurology as an outpatient.  Final Clinical Impressions(s) / ED Diagnoses   Final diagnoses:  Post concussive syndrome    New Prescriptions Discharge Medication List as of 09/23/2015  5:40 PM    START taking these medications   Details  butalbital-acetaminophen-caffeine (FIORICET) 50-325-40 MG tablet Take 1 tablet by mouth every 6 (six) hours as needed for headache.,  Starting Sun 09/23/2015, Until Mon 09/22/2016, Print       I personally performed the services described in this documentation, which was scribed in my presence. The recorded information has been reviewed and is accurate.     Stacy Loveless, MD 09/24/15 [phone]

## 2015-11-22 ENCOUNTER — Other Ambulatory Visit: Payer: Self-pay | Admitting: Gastroenterology

## 2015-11-23 NOTE — Progress Notes (Addendum)
I was unable to reach patient for pre- procedure call. I left the following message on verified voice mail:  Arrive at 7:45 to [facility] En[facility], free [employer] [facility] [facility], register in the Admitting office, follow prep as instructed,  Do not eat or drink after midnight, do not take any medication, since we were unable to update your medication list, ( if you are still on Lorstan-HCTZ)   The anesthesia department ask that you not take the morning of sedation), you need a driver and someone to stay with you after procedure, ?'s / or problems  Call [facility] on Monday at [phone].

## 2015-11-25 ENCOUNTER — Ambulatory Visit (HOSPITAL_COMMUNITY)
Admission: EM | Admit: 2015-11-25 | Discharge: 2015-11-25 | Disposition: A | Discharge status: Home / Self Care | Payer: Medicare Other | Attending: Emergency Medicine | Admitting: Emergency Medicine

## 2015-11-25 ENCOUNTER — Encounter (HOSPITAL_COMMUNITY): Payer: Self-pay | Admitting: Family Medicine

## 2015-11-25 DIAGNOSIS — K0889 Other specified disorders of teeth and supporting structures: Secondary | ICD-10-CM

## 2015-11-25 DIAGNOSIS — I10 Essential (primary) hypertension: Secondary | ICD-10-CM | POA: Diagnosis not present

## 2015-11-25 DIAGNOSIS — K029 Dental caries, unspecified: Secondary | ICD-10-CM

## 2015-11-25 MED ORDER — AMOXICILLIN 500 MG PO CAPS: 500.0000 mg | ORAL_CAPSULE | Freq: Three times a day (TID) | ORAL | 0 refills | Status: AC

## 2015-11-25 NOTE — ED Provider Notes (Signed)
CSN: [id number]     Arrival date & time 11/25/15  1236 History   First MD Initiated Contact with Patient 11/25/15 1401     Chief Complaint  Patient presents with  . Dental Pain   (Consider location/radiation/quality/duration/timing/severity/associated sxs/prior Treatment) 70 yo female presents with left upper gum pain. She states that the remainder of a broken tooth came out this weekend. It was too late to see a dentist, and over the last 24 hours her pain has increased. She denies fever or chills, but noted malaise. She feels that her left side of face is swollen. tylenol is helping, but he is suppose to have a colonoscopy tomorrow but isn't sure what to do about this.       Past Medical History:  Diagnosis Date  . Arthritis   . Fracture of humerus, proximal, right, closed 02/18/2013  . History of pulmonary embolus (PE)    after sx for fx humerous  . Hypertension   . Incontinence of bowel   . Incontinence of urine   . Right knee DJD    Past Surgical History:  Procedure Laterality Date  . JOINT REPLACEMENT  2010   Left TKR.  Marland Kitchen. ORIF HUMERUS FRACTURE Right 02/18/2013   Procedure: OPEN TREATMENT RIGHT  HUMERAL SHAFT FRACTURE WITH HUMERAL ROD AND LOCKING SCREWS;  Surgeon: Eulas PostJoshua P Landau, MD;  Location: [facility];  Service: Orthopedics;  Laterality: Right;  . TOTAL KNEE ARTHROPLASTY Right 05/09/2013   Procedure: TOTAL KNEE ARTHROPLASTY;  Surgeon: Nilda Simmerobert A Wainer, MD;  Location: [facility];  Service: Orthopedics;  Laterality: Right;   History reviewed. No pertinent family history. Social History  Substance Use Topics  . Smoking status: Never Smoker  . Smokeless tobacco: Never Used  . Alcohol use No   OB History    No data available     Review of Systems  HENT: Positive for dental problem and facial swelling. Negative for drooling, rhinorrhea, sinus pain, sinus pressure and sore throat.     Allergies  Percocet [oxycodone-acetaminophen]  Home Medications    Prior to Admission medications   Medication Sig Start Date End Date Taking? Authorizing Provider  amoxicillin (AMOXIL) 500 MG capsule Take 1 capsule (500 mg total) by mouth 3 (three) times daily. 11/25/15   Riki SheerMichelle G Nasiah Polinsky, PA-C  aspirin 81 MG tablet Take 81 mg by mouth daily.    Historical Provider, MD  butalbital-acetaminophen-caffeine (FIORICET) 50-325-40 MG tablet Take 1 tablet by mouth every 6 (six) hours as needed for headache. 09/23/15 09/22/16  Pricilla LovelessScott Goldston, MD  celecoxib (CELEBREX) 200 MG capsule Take 1 capsule (200 mg total) by mouth daily. 05/12/13   Kirstin Shepperson, PA-C  docusate sodium 100 MG CAPS 1 tab 2 times a day while on narcotics.  STOOL SOFTENER 05/12/13   Kirstin Shepperson, PA-C  Fish Oil OIL by Does not apply route.    Historical Provider, MD  LORazepam (ATIVAN) 0.5 MG tablet Take 1 tablet (0.5 mg total) by mouth at bedtime as needed and may repeat dose one time if needed for anxiety. 10/13/14   Elvina SidleKurt Lauenstein, MD  losartan-hydrochlorothiazide (HYZAAR) 50-12.5 MG tablet Take 0.5 tablets by mouth daily. 05/15/15   Morrell RiddleSarah L Weber, PA-C  methocarbamol (ROBAXIN) 500 MG tablet Take 500 mg by mouth 4 (four) times daily. For muscle spams 02/18/13   Teryl LucyJoshua Landau, MD  Multiple Vitamins-Minerals (MULTIVITAMIN WITH MINERALS) tablet Take 1 tablet by mouth daily.    Historical Provider, MD   Meds Ordered and Administered this  Visit  Medications - No data to display  BP 147/74   Pulse 62   Temp 98.6 F (37 C)   Resp 18   SpO2 100%  No data found.   Physical Exam  Constitutional: She is oriented to person, place, and time. She appears well-developed and well-nourished. No distress.  HENT:  Head: Normocephalic and atraumatic.  Mouth/Throat: Oropharynx is clear and moist. No oropharyngeal exudate.  No frank swelling of the face is noted, left upper gum with missing tooth, mild gum swelling and pain with palpation without frank abscess noted  Pulmonary/Chest: Breath sounds  normal.  Lymphadenopathy:    She has no cervical adenopathy.  Neurological: She is alert and oriented to person, place, and time.  Skin: Skin is warm and dry. She is not diaphoretic.  Psychiatric: Her behavior is normal.  Nursing note and vitals reviewed.   Urgent Care Course   Clinical Course     Procedures (including critical care time)  Labs Review Labs Reviewed - No data to display  Imaging Review No results found.   Visual Acuity Review  Right Eye Distance:   Left Eye Distance:   Bilateral Distance:    Right Eye Near:   Left Eye Near:    Bilateral Near:         MDM   1. Dental caries   2. Pain, dental   3. Essential hypertension    Treat with antibiotics for probable infection, no frank abscess. Use Tylenol as needed. With infection would RS your colonoscopy for tomorrow, but call numbers provided by you to discuss with GI further. F/U with PCP regarding elevated BP reading.     Riki SheerMichelle G Alyanah Elliott, PA-C 11/25/15 1416

## 2015-11-25 NOTE — ED Triage Notes (Signed)
Pt here for left upper dental pain from broken tooth that came out this weekend. Pt left face swollen.

## 2015-11-25 NOTE — Discharge Instructions (Signed)
You appear to have an infection in the upper gum. With an infection and not feeling well suggest you reschedule your colonoscopy. Please call the number on call to rearrange your appt. If your pain increases or signs of worsening infection then please report to the ER. Hope you feel better.

## 2015-11-26 ENCOUNTER — Ambulatory Visit (HOSPITAL_COMMUNITY): Admission: RE | Admit: 2015-11-26 | Payer: Medicare Other | Source: Ambulatory Visit | Admitting: Gastroenterology

## 2015-11-26 SURGERY — COLONOSCOPY WITH PROPOFOL
Anesthesia: Monitor Anesthesia Care

## 2017-08-24 ENCOUNTER — Ambulatory Visit (INDEPENDENT_AMBULATORY_CARE_PROVIDER_SITE_OTHER): Payer: Medicare Other | Admitting: Orthopedic Surgery

## 2017-08-24 VITALS — Ht 64.0 in | Wt 282.0 lb

## 2017-08-24 DIAGNOSIS — I87333 Chronic venous hypertension (idiopathic) with ulcer and inflammation of bilateral lower extremity: Secondary | ICD-10-CM

## 2017-08-24 DIAGNOSIS — L97929 Non-pressure chronic ulcer of unspecified part of left lower leg with unspecified severity: Secondary | ICD-10-CM | POA: Diagnosis not present

## 2017-08-24 DIAGNOSIS — L97919 Non-pressure chronic ulcer of unspecified part of right lower leg with unspecified severity: Secondary | ICD-10-CM | POA: Diagnosis not present

## 2017-09-02 ENCOUNTER — Encounter (INDEPENDENT_AMBULATORY_CARE_PROVIDER_SITE_OTHER): Payer: Self-pay | Admitting: Orthopedic Surgery

## 2017-09-02 DIAGNOSIS — L97919 Non-pressure chronic ulcer of unspecified part of right lower leg with unspecified severity: Secondary | ICD-10-CM | POA: Insufficient documentation

## 2017-09-02 DIAGNOSIS — I87333 Chronic venous hypertension (idiopathic) with ulcer and inflammation of bilateral lower extremity: Principal | ICD-10-CM | POA: Insufficient documentation

## 2017-09-02 DIAGNOSIS — L97929 Non-pressure chronic ulcer of unspecified part of left lower leg with unspecified severity: Principal | ICD-10-CM

## 2017-09-02 NOTE — Progress Notes (Signed)
Office Visit Note   Patient: Stacy Wilkinson           Date of Birth: 1945-03-27           MRN: [id number] Visit Date: 08/24/2017              Requested by: Sigmund HazelMiller, Lisa, MD [address] [place], [place] [address] PCP: Sigmund HazelMiller, Lisa, MD  Chief Complaint  Patient presents with  . Left Leg - Pain      HPI: Patient is a 72 year old woman who was seen in referral from Dr. Thurston HoleWainer for swelling redness and warmth of both lower extremities patient was initially started on Keflex and this is been changed to doxycycline she is not on pain medication.  Assessment & Plan: Visit Diagnoses:  1. 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  Recommend elevation compression and exercise to increase the calf pump and follow-up if she develops any drainage or cellulitis.  Discussed that compression is a lifetime necessity for her for the venous insufficiency discussed risks of ulceration and risk of potential surgery.  Follow-Up Instructions: Return if symptoms worsen or fail to improve.   Ortho Exam  Patient is alert, oriented, no adenopathy, well-dressed, normal affect, normal respiratory effort. Patient has a good dorsalis pedis pulse she has brawny skin color changes and dermatitis that extends up to the tibial tubercle there is pitting edema there is no drainage no cellulitis.  On examination patient's calf is 52 cm in circumference her ankle is 34 cm in circumference she does have massive venous insufficiency.  Imaging: No results found. No images are attached to the encounter.  Labs: Lab Results  Component Value Date   REPTSTATUS 04/30/2013 FINAL 04/29/2013   CULT NO GROWTH Performed at [facility] 04/29/2013     Lab Results  Component Value Date   ALBUMIN 3.9 04/29/2013   ALBUMIN 4.1 01/10/2012   ALBUMIN 4.2 08/08/2008    Body mass index is  48.41 kg/m.  Orders:  No orders of the defined types were placed in this encounter.  No orders of the defined types were placed in this encounter.    Procedures: No procedures performed  Clinical Data: No additional findings.  ROS:  All other systems negative, except as noted in the HPI. Review of Systems  Objective: Vital Signs: Ht 5\' 4"  (1.626 m)   Wt 282 lb (127.9 kg)   BMI 48.41 kg/m   Specialty Comments:  No specialty comments available.  PMFS History: Patient Active Problem List   Diagnosis Date Noted  . 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 History of pulmonary embolism 05/12/2013  . 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 Hypertension   . Fracture of humerus, proximal, right, closed 02/18/2013  . OSTEOARTHRITIS 08/31/2008   Past Medical History:  Diagnosis Date  . Arthritis   . Fracture of humerus, proximal, right, closed 02/18/2013  . History of pulmonary embolus (PE)    after sx for fx humerous  . Hypertension   . Incontinence of bowel   . Incontinence of urine   . Right knee DJD     History reviewed. No pertinent family history.  Past Surgical History:  Procedure Laterality Date  . JOINT REPLACEMENT  2010   Left TKR.  Marland Kitchen. ORIF HUMERUS FRACTURE Right 02/18/2013   Procedure: OPEN TREATMENT RIGHT  HUMERAL SHAFT FRACTURE WITH HUMERAL ROD AND LOCKING SCREWS;  Surgeon: Eulas PostJoshua P Landau, MD;  Location: [facility];  Service: Orthopedics;  Laterality: Right;  . TOTAL KNEE ARTHROPLASTY Right 05/09/2013   Procedure: TOTAL KNEE ARTHROPLASTY;  Surgeon: Nilda Simmerobert A Wainer, MD;  Location: [facility];  Service: Orthopedics;  Laterality: Right;   Social History   Occupational History  . Not on file  Tobacco Use  . Smoking status: Never Smoker  . Smokeless tobacco: Never Used  Substance and Sexual Activity  . Alcohol use: No  . Drug use: No  . Sexual activity: Never

## 2018-03-24 ENCOUNTER — Other Ambulatory Visit: Payer: Self-pay | Admitting: Gastroenterology

## 2018-05-04 ENCOUNTER — Other Ambulatory Visit: Payer: Self-pay | Admitting: Orthopedic Surgery

## 2018-05-04 DIAGNOSIS — M25511 Pain in right shoulder: Secondary | ICD-10-CM

## 2018-06-01 ENCOUNTER — Encounter (HOSPITAL_COMMUNITY): Payer: Self-pay

## 2018-06-01 ENCOUNTER — Ambulatory Visit (HOSPITAL_COMMUNITY): Admit: 2018-06-01 | Payer: Medicare Other | Admitting: Gastroenterology

## 2018-06-01 SURGERY — COLONOSCOPY WITH PROPOFOL
Anesthesia: Monitor Anesthesia Care

## 2018-06-22 ENCOUNTER — Other Ambulatory Visit: Payer: Self-pay

## 2018-08-30 ENCOUNTER — Other Ambulatory Visit: Payer: Self-pay | Admitting: Orthopedic Surgery

## 2018-08-30 DIAGNOSIS — G8929 Other chronic pain: Secondary | ICD-10-CM

## 2018-08-30 DIAGNOSIS — M25511 Pain in right shoulder: Secondary | ICD-10-CM

## 2018-09-17 ENCOUNTER — Ambulatory Visit
Admission: RE | Admit: 2018-09-17 | Discharge: 2018-09-17 | Disposition: A | Discharge status: Home / Self Care | Payer: Medicare Other | Source: Ambulatory Visit | Attending: Orthopedic Surgery | Admitting: Orthopedic Surgery

## 2018-09-17 ENCOUNTER — Other Ambulatory Visit: Payer: Self-pay

## 2018-09-17 DIAGNOSIS — M25511 Pain in right shoulder: Secondary | ICD-10-CM

## 2018-09-17 DIAGNOSIS — G8929 Other chronic pain: Secondary | ICD-10-CM

## 2018-09-17 MED ORDER — IOPAMIDOL (ISOVUE-M 200) INJECTION 41%
13.0000 mL | Freq: Once | INTRAMUSCULAR | Status: AC
  Administered 2018-09-17: 13 mL via INTRA_ARTICULAR

## 2019-06-29 ENCOUNTER — Other Ambulatory Visit: Payer: Self-pay | Admitting: Orthopedic Surgery

## 2019-06-29 DIAGNOSIS — M25511 Pain in right shoulder: Secondary | ICD-10-CM

## 2020-03-19 DIAGNOSIS — I10 Essential (primary) hypertension: Secondary | ICD-10-CM | POA: Diagnosis not present

## 2020-03-19 DIAGNOSIS — R609 Edema, unspecified: Secondary | ICD-10-CM | POA: Diagnosis not present

## 2020-03-23 DIAGNOSIS — R609 Edema, unspecified: Secondary | ICD-10-CM | POA: Diagnosis not present

## 2020-03-23 DIAGNOSIS — L03116 Cellulitis of left lower limb: Secondary | ICD-10-CM | POA: Diagnosis not present

## 2020-07-05 IMAGING — XA FLUORO GUIDED NEEDLE PLACEMENT AND/OR ASPIRATION
2 series · 2 of 2 positions shown · non-contrast
Comparison: none

CLINICAL DATA: Right shoulder pain.  Previous humerus fracture.

[Series 1: ortho standard · 1 of 1 slices shown (1 of 2)]
[im 1/1]
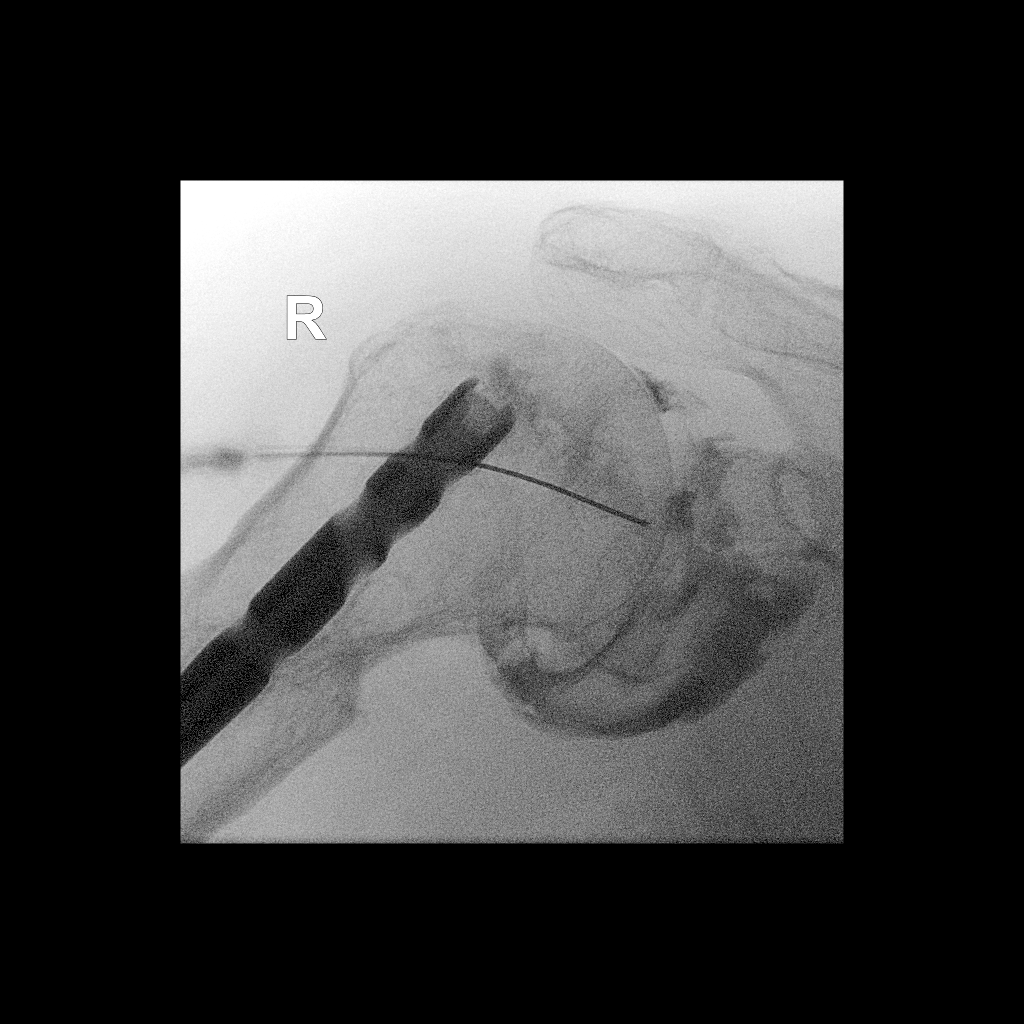

[Series 2: ortho standard · 1 of 1 slices shown (2 of 2)]
[im 1/1]
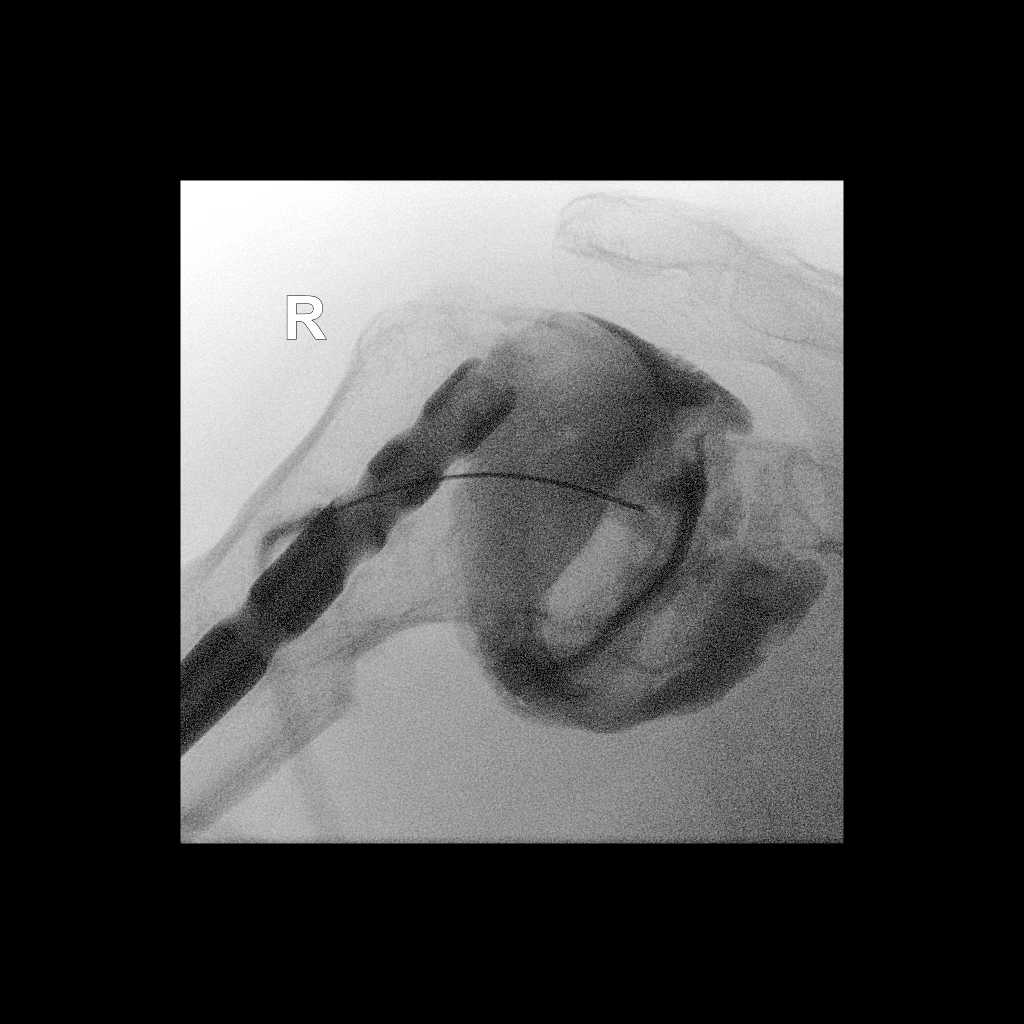

[2 of 2 positions shown; findings below may reference images not displayed]

FLUOROSCOPY TIME:  Radiation Exposure Index (as provided by the
fluoroscopic device): 19.41 uGy*m2

PROCEDURE:
Left shoulder INJECTION UNDER FLUOROSCOPY

An appropriate skin entrance site was determined. The site was
marked, prepped with Betadine, draped in the usual sterile fashion,
and infiltrated locally with 1% Lidocaine. A 22 gauge spinal needle
was advanced to the superolateral margin of the femoral head under
intermittent fluoroscopy. 1 ml of Lidocaine injected easily. A
mixture of 0.1 ml Multihance in 20 ml of dilute Isovue-M 200 was
then used to opacify the right shoulder hip joint. No immediate
complication.
IMPRESSION: Technically successful right shoulder injection for MRI.

## 2020-07-05 IMAGING — CT CT OF THE RIGHT SHOULDER WITH CONTRAST
1 series · 12 of 14 positions shown, 15 images · IV contrast (agent unspecified)
Comparison: X-ray 02/12/2013

CONTRAST:  See injection documentation

CLINICAL DATA: Right shoulder pain, limited range of motion

EXAM:
CT OF THE UPPER RIGHT EXTREMITY WITH CONTRAST
TECHNIQUE: Multidetector CT imaging of the upper right extremity was performed
according to the standard protocol following intra-articular
contrast administration.

[Series 3: soft tissue · axial · 0.48mm/px · z∈[-212,-38]mm · 12 of 103 slices shown, 15 images]
[im 8/103  soft-tissue]
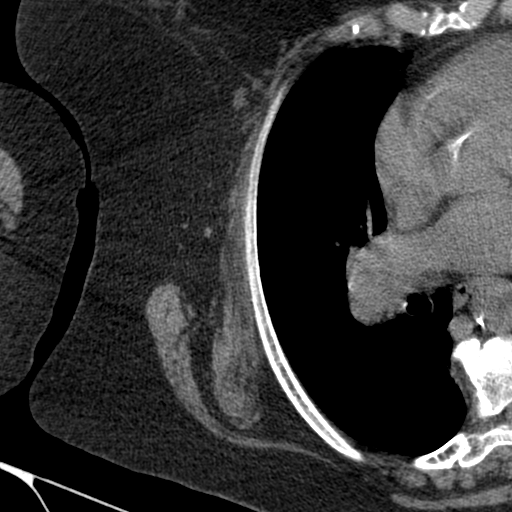
[im 8/103  bone]
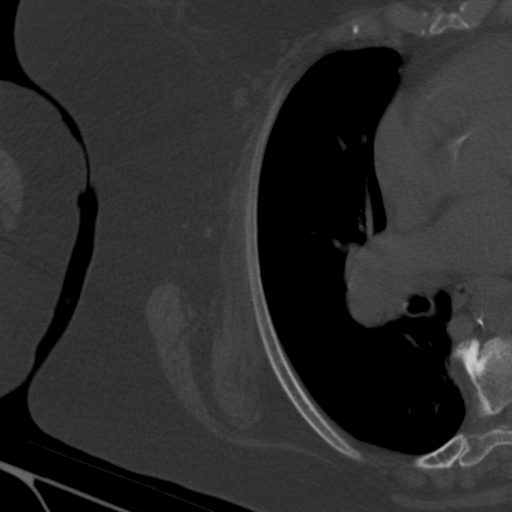
[im 16/103  bone]
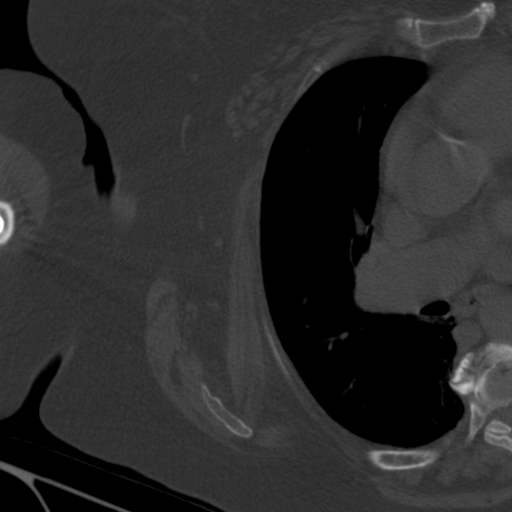
[im 24/103  bone]
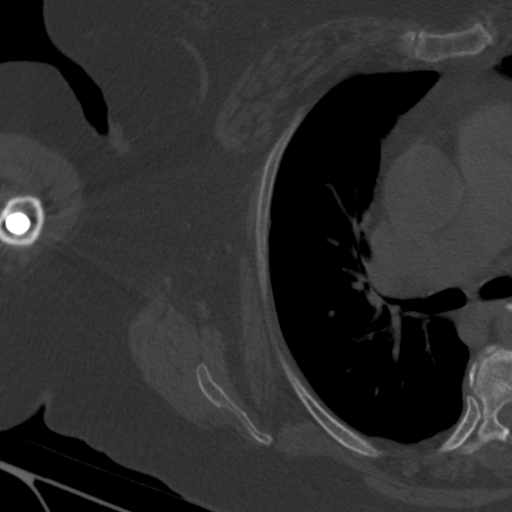
[im 32/103  bone]
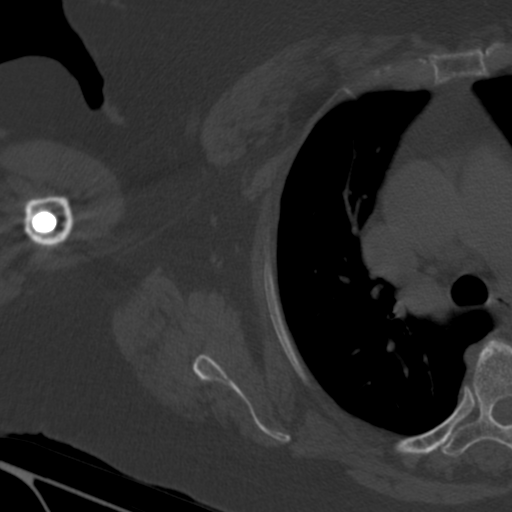
[im 40/103  soft-tissue]
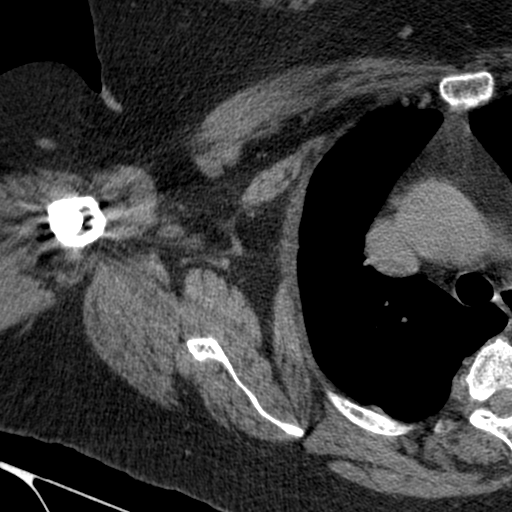
[im 40/103  bone]
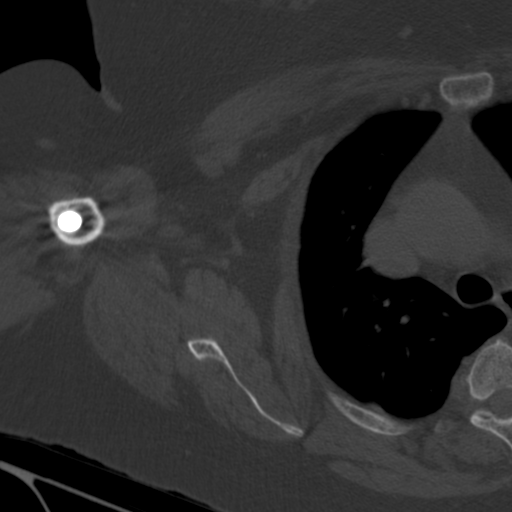
[im 48/103  bone]
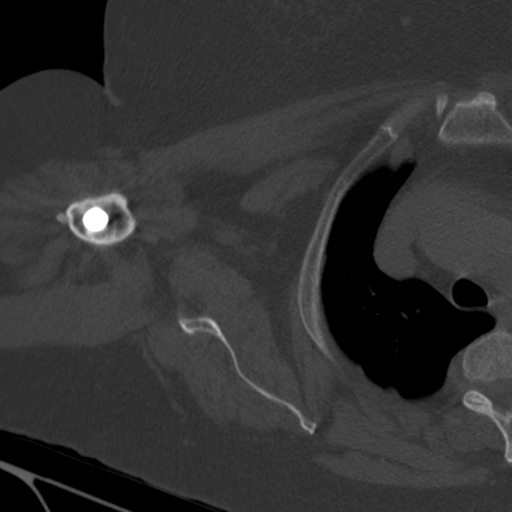
[im 55/103  bone]
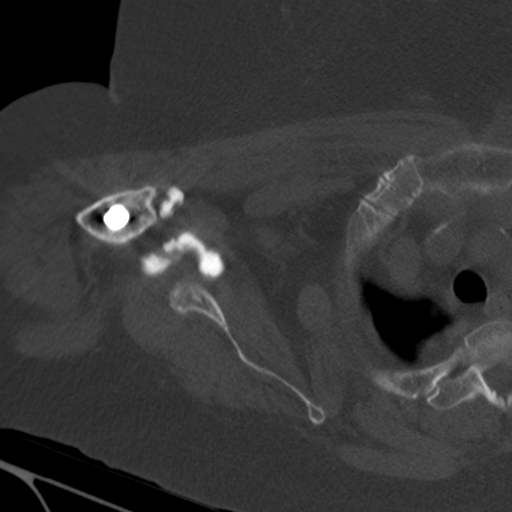
[im 63/103  bone]
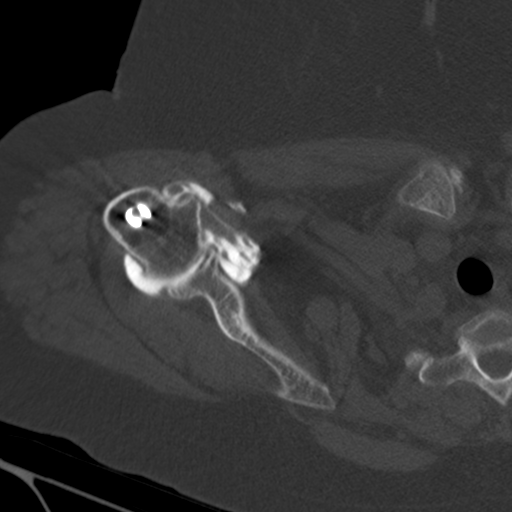
[im 71/103  soft-tissue]
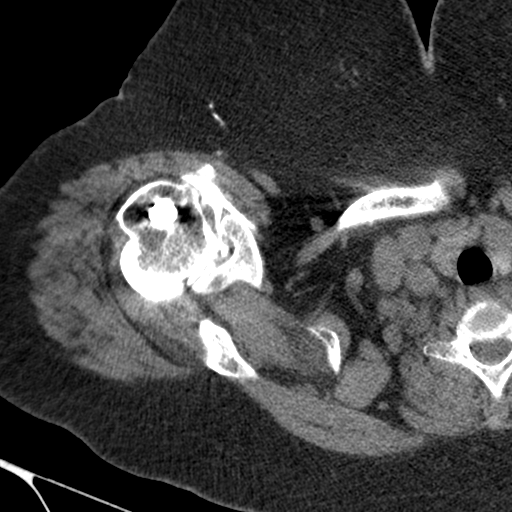
[im 71/103  bone]
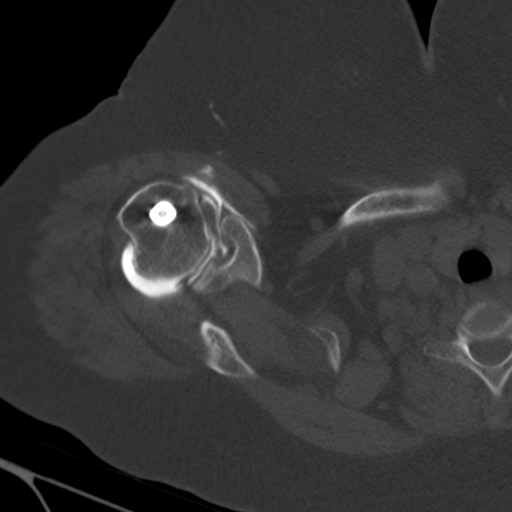
[im 79/103  bone]
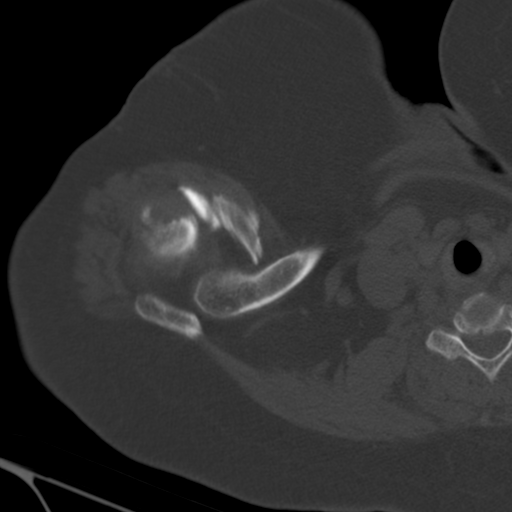
[im 87/103  bone]
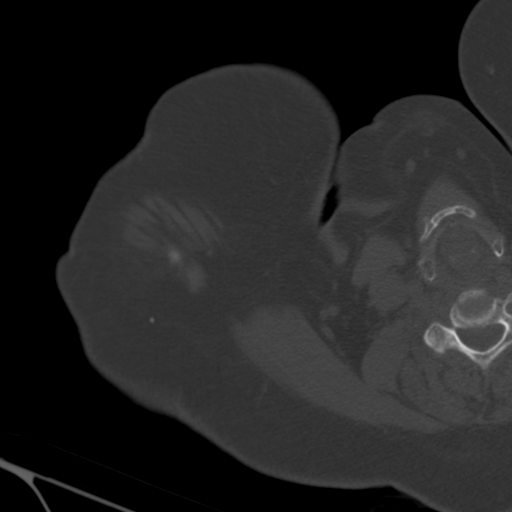
[im 95/103  bone]
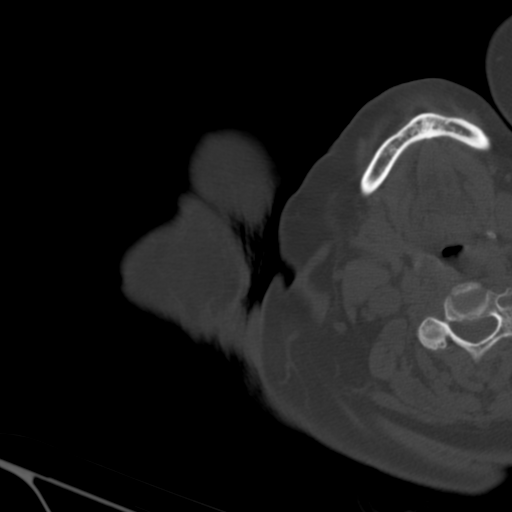

[12 of 14 positions shown; findings below may reference images not displayed]

FINDINGS: Rotator cuff: Intact supraspinatus and infraspinatus tendons. Mild
irregularity of the superior aspect of the distal subscapularis
tendon which may reflect partial tear. Tendon is overall intact.
Teres minor intact.

Muscles: No significant atrophy or fatty infiltration of the rotator
cuff musculature.

Biceps long head: Intact. There is a 9 x 6 mm loose body within the
proximal biceps tendon sheath

Acromioclavicular Joint: Minimal degenerative changes. Type 2
acromion. No contrast within the subacromial-subdeltoid bursa.

Glenohumeral Joint: Well distended with injected contrast. Moderate
cartilage loss of the central and posterior aspects of the glenoid
and posterior aspect of the humeral head.

Labrum: Suboptimally evaluated.  No obvious detached labral tear.

Bones: Prior healed proximal humeral fracture with partially
visualized intramedullary rod. Osseous structures are otherwise
intact.

Other: Multilevel degenerative changes of the visualized lower
cervical and thoracic spines. Visualized portion of the right lung
is clear.
IMPRESSION: 1. Grossly intact rotator cuff. Probable tendinosis and superior
tendinous fraying of the subscapularis.
2. Moderate cartilage loss of the humeral head and glenoid articular
surfaces.
3. Loose body within the biceps tendon sheath.

## 2021-08-30 DIAGNOSIS — R7303 Prediabetes: Secondary | ICD-10-CM | POA: Diagnosis not present

## 2021-10-14 DIAGNOSIS — Z23 Encounter for immunization: Secondary | ICD-10-CM | POA: Diagnosis not present

## 2021-10-14 DIAGNOSIS — E7800 Pure hypercholesterolemia, unspecified: Secondary | ICD-10-CM | POA: Diagnosis not present

## 2021-10-14 DIAGNOSIS — R7303 Prediabetes: Secondary | ICD-10-CM | POA: Diagnosis not present

## 2021-10-14 DIAGNOSIS — I10 Essential (primary) hypertension: Secondary | ICD-10-CM | POA: Diagnosis not present

## 2021-10-16 DIAGNOSIS — I10 Essential (primary) hypertension: Secondary | ICD-10-CM | POA: Diagnosis not present

## 2021-10-16 DIAGNOSIS — E7800 Pure hypercholesterolemia, unspecified: Secondary | ICD-10-CM | POA: Diagnosis not present

## 2021-10-16 DIAGNOSIS — R7303 Prediabetes: Secondary | ICD-10-CM | POA: Diagnosis not present

## 2021-12-19 DIAGNOSIS — M25561 Pain in right knee: Secondary | ICD-10-CM | POA: Diagnosis not present

## 2022-02-04 DIAGNOSIS — Z1231 Encounter for screening mammogram for malignant neoplasm of breast: Secondary | ICD-10-CM | POA: Diagnosis not present

## 2022-02-11 ENCOUNTER — Emergency Department (HOSPITAL_BASED_OUTPATIENT_CLINIC_OR_DEPARTMENT_OTHER)
Admission: EM | Admit: 2022-02-11 | Discharge: 2022-02-11 | Disposition: A | Discharge status: Home / Self Care | Payer: Medicare Other | Attending: Emergency Medicine | Admitting: Emergency Medicine

## 2022-02-11 ENCOUNTER — Emergency Department (HOSPITAL_BASED_OUTPATIENT_CLINIC_OR_DEPARTMENT_OTHER): Payer: Medicare Other

## 2022-02-11 ENCOUNTER — Other Ambulatory Visit: Payer: Self-pay

## 2022-02-11 ENCOUNTER — Encounter (HOSPITAL_BASED_OUTPATIENT_CLINIC_OR_DEPARTMENT_OTHER): Payer: Self-pay | Admitting: Emergency Medicine

## 2022-02-11 DIAGNOSIS — W19XXXA Unspecified fall, initial encounter: Secondary | ICD-10-CM

## 2022-02-11 DIAGNOSIS — Y9301 Activity, walking, marching and hiking: Secondary | ICD-10-CM | POA: Diagnosis not present

## 2022-02-11 DIAGNOSIS — W109XXA Fall (on) (from) unspecified stairs and steps, initial encounter: Secondary | ICD-10-CM | POA: Diagnosis not present

## 2022-02-11 DIAGNOSIS — S0990XA Unspecified injury of head, initial encounter: Secondary | ICD-10-CM | POA: Diagnosis not present

## 2022-02-11 DIAGNOSIS — S060X0A Concussion without loss of consciousness, initial encounter: Secondary | ICD-10-CM | POA: Diagnosis not present

## 2022-02-11 DIAGNOSIS — Z7982 Long term (current) use of aspirin: Secondary | ICD-10-CM | POA: Diagnosis not present

## 2022-02-11 MED ORDER — MECLIZINE HCL 25 MG PO TABS
25.0000 mg | ORAL_TABLET | Freq: Once | ORAL | Status: AC
  Administered 2022-02-11: 25 mg via ORAL
  Filled 2022-02-11: qty 1

## 2022-02-11 MED ORDER — ACETAMINOPHEN 500 MG PO TABS
1000.0000 mg | ORAL_TABLET | Freq: Once | ORAL | Status: AC
  Administered 2022-02-11: 1000 mg via ORAL
  Filled 2022-02-11: qty 2

## 2022-02-11 NOTE — ED Provider Notes (Signed)
[facility] Provider Note   CSN: [id number] Arrival date & time: 02/11/22  1428     History  Chief Complaint  Patient presents with   Fall    Stacy Wilkinson is a 77 y.o. female.   Fall     77 year old female with medical history significant for arthritis, history of joint replacement of the knee who presents to the emergency department after a mechanical fall.  The patient states that she fell yesterday while walking down steps and struck her head on the side of a car.  She denies any loss of consciousness.  She is not on anticoagulation.  She denies any neck pain.  She denies any numbness, weakness.  She did endorse some lightheadedness and headache earlier today and presents to the emergency department for further evaluation.  She arrives to the emergency department GCS 15, ABC intact.  She currently denies any numbness, weakness, unsteadiness on her feet.  She does not currently feel any room spinning dizziness.  Home Medications Prior to Admission medications   Medication Sig Start Date End Date Taking? Authorizing Provider  amoxicillin (AMOXIL) 500 MG capsule Take 1 capsule (500 mg total) by mouth 3 (three) times daily. Patient not taking: Reported on 08/24/2017 11/25/15   Bjorn Pippin, PA-C  aspirin 81 MG tablet Take 81 mg by mouth daily.    [provider]  celecoxib (CELEBREX) 200 MG capsule Take 1 capsule (200 mg total) by mouth daily. 05/12/13   Shepperson, Kirstin, PA-C  docusate sodium 100 MG CAPS 1 tab 2 times a day while on narcotics.  STOOL SOFTENER 05/12/13   Shepperson, Kirstin, PA-C  Fish Oil OIL by Does not apply route.    [provider]  LORazepam (ATIVAN) 0.5 MG tablet Take 1 tablet (0.5 mg total) by mouth at bedtime as needed and may repeat dose one time if needed for anxiety. 10/13/14   Robyn Haber, MD  losartan-hydrochlorothiazide (HYZAAR) 50-12.5 MG tablet Take 0.5 tablets by mouth daily.  05/15/15   Weber, Damaris Hippo, PA-C  methocarbamol (ROBAXIN) 500 MG tablet Take 500 mg by mouth 4 (four) times daily. For muscle spams 02/18/13   Marchia Bond, MD  Multiple Vitamins-Minerals (MULTIVITAMIN WITH MINERALS) tablet Take 1 tablet by mouth daily.    [provider]      Allergies    Percocet [oxycodone-acetaminophen]    Review of Systems   Review of Systems  All other systems reviewed and are negative.   Physical Exam Updated Vital Signs BP 122/84 (BP Location: Right Arm)   Pulse 77   Temp 97.8 F (36.6 C)   Resp 20   Ht 5\' 2"  (1.575 m)   Wt 127 kg   SpO2 100%   BMI 51.21 kg/m  Physical Exam Vitals and nursing note reviewed.  Constitutional:      General: She is not in acute distress.    Appearance: She is well-developed.     Comments: GCS 15, ABC intact  HENT:     Head: Normocephalic and atraumatic.  Eyes:     Extraocular Movements: Extraocular movements intact.     Conjunctiva/sclera: Conjunctivae normal.     Pupils: Pupils are equal, round, and reactive to light.  Neck:     Comments: No midline tenderness to palpation of the cervical spine.  Range of motion intact Cardiovascular:     Rate and Rhythm: Normal rate and regular rhythm.  Pulmonary:     Effort: Pulmonary effort  is normal. No respiratory distress.     Breath sounds: Normal breath sounds.  Chest:     Comments: Clavicles stable nontender to AP compression.  Chest wall stable and nontender to AP and lateral compression. Abdominal:     Palpations: Abdomen is soft.     Tenderness: There is no abdominal tenderness.     Comments: Pelvis stable to lateral compression  Musculoskeletal:     Cervical back: Neck supple.     Comments: No midline tenderness to palpation of the thoracic or lumbar spine.  Extremities atraumatic with intact range of motion  Skin:    General: Skin is warm and dry.  Neurological:     Mental Status: She is alert.     Comments: Cranial nerves II through XII grossly  intact.  Moving all 4 extremities spontaneously.  Sensation grossly intact all 4 extremities     ED Results / Procedures / Treatments   Labs (all labs ordered are listed, but only abnormal results are displayed) Labs Reviewed - No data to display  EKG None  Radiology CT Head Wo Contrast  Result Date: 02/11/2022 CLINICAL DATA:  Fall EXAM: CT HEAD WITHOUT CONTRAST TECHNIQUE: Contiguous axial images were obtained from the base of the skull through the vertex without intravenous contrast. RADIATION DOSE REDUCTION: This exam was performed according to the departmental dose-optimization program which includes automated exposure control, adjustment of the mA and/or kV according to patient size and/or use of iterative reconstruction technique. COMPARISON:  09/10/2015 FINDINGS: Brain: No evidence of acute infarction, hemorrhage, mass, mass effect, or midline shift. No hydrocephalus or extra-axial fluid collection. Vascular: No hyperdense vessel. Skull: Negative for fracture or focal lesion. Sinuses/Orbits: Complete opacification of the imaged right maxillary sinus, including the ostiomeatal complex, as well as the right anterior ethmoid air cells. Status post bilateral lens replacements. Other: The mastoid air cells are well aerated. IMPRESSION: No acute intracranial process. Electronically Signed   By: Merilyn Baba M.D.   On: 02/11/2022 18:21    Procedures Procedures    Medications Ordered in ED Medications  meclizine (ANTIVERT) tablet 25 mg (has no administration in time range)  acetaminophen (TYLENOL) tablet 1,000 mg (has no administration in time range)    ED Course/ Medical Decision Making/ A&P                             Medical Decision Making Risk OTC drugs.    77 year old female with medical history significant for arthritis, history of joint replacement of the knee who presents to the emergency department after a mechanical fall.  The patient states that she fell yesterday while  walking down steps and struck her head on the side of a car.  She denies any loss of consciousness.  She is not on anticoagulation.  She denies any neck pain.  She denies any numbness, weakness.  She did endorse some lightheadedness and headache earlier today and presents to the emergency department for further evaluation.  She arrives to the emergency department GCS 15, ABC intact.  She currently denies any numbness, weakness, unsteadiness on her feet.  She does not currently feel any room spinning dizziness.  On arrival, the patient was vitally stable, neurologically intact.  Ambulatory in the emergency department without any ataxia.  No dysmetria noted on exam and no focal deficits.  Patient presenting after mechanical fall.  Her C-spine was cleared by Nexus criteria.  Head CT was obtained with no evidence  of subarachnoid hemorrhage or subdural hematoma.  Patient is overall well-appearing.  Will treat with Tylenol and meclizine and have the patient follow-up outpatient for likely concussion.  Return precautions provided.  Stable for discharge.   Final Clinical Impression(s) / ED Diagnoses Final diagnoses:  Concussion without loss of consciousness, initial encounter  Fall, initial encounter    Rx / DC Orders ED Discharge Orders     None         Ernie Avena, MD 02/11/22 1905

## 2022-02-11 NOTE — ED Notes (Signed)
Pt verbalized understanding of d/c instructions, meds, and followup care. Denies questions. VSS, no distress noted. Steady gait to exit with all belongings.  ?

## 2022-02-11 NOTE — Discharge Instructions (Signed)
Recommend Tylenol and Motrin for pain control.  Follow-up with your PCP.  Recommend cognitive rest.

## 2022-02-11 NOTE — ED Triage Notes (Signed)
Pt reports slipping and falling on the second of third step while walking out of her home last night, pt reports she fell into her vehicle and hit her head, denies blood thinner use and LOC, pt reports waking this am with dizziness and lightheadedness

## 2022-02-19 DIAGNOSIS — R7303 Prediabetes: Secondary | ICD-10-CM | POA: Diagnosis not present

## 2022-02-19 DIAGNOSIS — I10 Essential (primary) hypertension: Secondary | ICD-10-CM | POA: Diagnosis not present

## 2022-02-19 DIAGNOSIS — Z0000 Encounter for general adult medical examination without abnormal findings: Secondary | ICD-10-CM | POA: Diagnosis not present

## 2022-02-19 DIAGNOSIS — E7800 Pure hypercholesterolemia, unspecified: Secondary | ICD-10-CM | POA: Diagnosis not present

## 2022-03-03 DIAGNOSIS — Z1211 Encounter for screening for malignant neoplasm of colon: Secondary | ICD-10-CM | POA: Diagnosis not present

## 2022-03-19 DIAGNOSIS — E7800 Pure hypercholesterolemia, unspecified: Secondary | ICD-10-CM | POA: Diagnosis not present

## 2022-03-19 DIAGNOSIS — Z96653 Presence of artificial knee joint, bilateral: Secondary | ICD-10-CM | POA: Diagnosis not present

## 2022-03-19 DIAGNOSIS — R7303 Prediabetes: Secondary | ICD-10-CM | POA: Diagnosis not present

## 2022-03-19 DIAGNOSIS — I10 Essential (primary) hypertension: Secondary | ICD-10-CM | POA: Diagnosis not present

## 2022-05-06 DIAGNOSIS — M79621 Pain in right upper arm: Secondary | ICD-10-CM | POA: Diagnosis not present

## 2022-09-16 DIAGNOSIS — E7800 Pure hypercholesterolemia, unspecified: Secondary | ICD-10-CM | POA: Diagnosis not present

## 2022-09-16 DIAGNOSIS — R7303 Prediabetes: Secondary | ICD-10-CM | POA: Diagnosis not present

## 2022-09-16 DIAGNOSIS — I10 Essential (primary) hypertension: Secondary | ICD-10-CM | POA: Diagnosis not present

## 2022-10-06 DIAGNOSIS — M25511 Pain in right shoulder: Secondary | ICD-10-CM | POA: Diagnosis not present

## 2022-11-02 DIAGNOSIS — R0981 Nasal congestion: Secondary | ICD-10-CM | POA: Diagnosis not present

## 2022-11-02 DIAGNOSIS — R0982 Postnasal drip: Secondary | ICD-10-CM | POA: Diagnosis not present

## 2022-11-17 DIAGNOSIS — M25511 Pain in right shoulder: Secondary | ICD-10-CM | POA: Diagnosis not present

## 2022-11-18 DIAGNOSIS — L309 Dermatitis, unspecified: Secondary | ICD-10-CM | POA: Diagnosis not present

## 2023-01-08 DIAGNOSIS — H2513 Age-related nuclear cataract, bilateral: Secondary | ICD-10-CM | POA: Diagnosis not present

## 2023-01-19 DIAGNOSIS — M25512 Pain in left shoulder: Secondary | ICD-10-CM | POA: Diagnosis not present

## 2023-02-10 DIAGNOSIS — Z1231 Encounter for screening mammogram for malignant neoplasm of breast: Secondary | ICD-10-CM | POA: Diagnosis not present

## 2023-02-23 DIAGNOSIS — Z0000 Encounter for general adult medical examination without abnormal findings: Secondary | ICD-10-CM | POA: Diagnosis not present

## 2023-03-10 DIAGNOSIS — Z1211 Encounter for screening for malignant neoplasm of colon: Secondary | ICD-10-CM | POA: Diagnosis not present

## 2023-03-11 DIAGNOSIS — M25512 Pain in left shoulder: Secondary | ICD-10-CM | POA: Diagnosis not present

## 2023-03-11 DIAGNOSIS — R7303 Prediabetes: Secondary | ICD-10-CM | POA: Diagnosis not present

## 2023-03-11 DIAGNOSIS — E7800 Pure hypercholesterolemia, unspecified: Secondary | ICD-10-CM | POA: Diagnosis not present

## 2023-03-11 DIAGNOSIS — I10 Essential (primary) hypertension: Secondary | ICD-10-CM | POA: Diagnosis not present

## 2023-03-11 DIAGNOSIS — Z23 Encounter for immunization: Secondary | ICD-10-CM | POA: Diagnosis not present

## 2023-04-02 DIAGNOSIS — M19012 Primary osteoarthritis, left shoulder: Secondary | ICD-10-CM | POA: Diagnosis not present

## 2023-08-20 DIAGNOSIS — I10 Essential (primary) hypertension: Secondary | ICD-10-CM | POA: Diagnosis not present

## 2023-08-20 DIAGNOSIS — E7800 Pure hypercholesterolemia, unspecified: Secondary | ICD-10-CM | POA: Diagnosis not present

## 2023-09-20 DIAGNOSIS — E7800 Pure hypercholesterolemia, unspecified: Secondary | ICD-10-CM | POA: Diagnosis not present

## 2023-09-20 DIAGNOSIS — I10 Essential (primary) hypertension: Secondary | ICD-10-CM | POA: Diagnosis not present

## 2023-10-05 DIAGNOSIS — Z23 Encounter for immunization: Secondary | ICD-10-CM | POA: Diagnosis not present

## 2023-10-05 DIAGNOSIS — I10 Essential (primary) hypertension: Secondary | ICD-10-CM | POA: Diagnosis not present

## 2023-10-05 DIAGNOSIS — R0683 Snoring: Secondary | ICD-10-CM | POA: Diagnosis not present

## 2023-10-05 DIAGNOSIS — E7800 Pure hypercholesterolemia, unspecified: Secondary | ICD-10-CM | POA: Diagnosis not present

## 2023-10-05 DIAGNOSIS — R7303 Prediabetes: Secondary | ICD-10-CM | POA: Diagnosis not present

## 2023-10-05 DIAGNOSIS — I5189 Other ill-defined heart diseases: Secondary | ICD-10-CM | POA: Diagnosis not present

## 2023-10-20 DIAGNOSIS — I10 Essential (primary) hypertension: Secondary | ICD-10-CM | POA: Diagnosis not present

## 2023-10-20 DIAGNOSIS — E7800 Pure hypercholesterolemia, unspecified: Secondary | ICD-10-CM | POA: Diagnosis not present

## 2023-11-12 DIAGNOSIS — R0683 Snoring: Secondary | ICD-10-CM | POA: Diagnosis not present

## 2023-11-16 DIAGNOSIS — H6993 Unspecified Eustachian tube disorder, bilateral: Secondary | ICD-10-CM | POA: Diagnosis not present

## 2023-11-16 DIAGNOSIS — R0982 Postnasal drip: Secondary | ICD-10-CM | POA: Diagnosis not present

## 2023-11-16 DIAGNOSIS — R030 Elevated blood-pressure reading, without diagnosis of hypertension: Secondary | ICD-10-CM | POA: Diagnosis not present

## 2023-11-16 DIAGNOSIS — H8113 Benign paroxysmal vertigo, bilateral: Secondary | ICD-10-CM | POA: Diagnosis not present

## 2023-11-16 DIAGNOSIS — R0989 Other specified symptoms and signs involving the circulatory and respiratory systems: Secondary | ICD-10-CM | POA: Diagnosis not present

## 2023-11-16 DIAGNOSIS — G479 Sleep disorder, unspecified: Secondary | ICD-10-CM | POA: Diagnosis not present
